# Patient Record
Sex: Female | Born: 1980 | Race: White | Hispanic: No | Marital: Married | Smoking: Never smoker
Health system: Southern US, Community
[De-identification: ages and names within clinical notes are randomized; demographics above are authoritative.]

## PROBLEM LIST (undated history)

## (undated) ENCOUNTER — Inpatient Hospital Stay (HOSPITAL_COMMUNITY): Payer: Self-pay

## (undated) DIAGNOSIS — L02412 Cutaneous abscess of left axilla: Secondary | ICD-10-CM

## (undated) DIAGNOSIS — O039 Complete or unspecified spontaneous abortion without complication: Secondary | ICD-10-CM

## (undated) DIAGNOSIS — F909 Attention-deficit hyperactivity disorder, unspecified type: Secondary | ICD-10-CM

## (undated) DIAGNOSIS — D219 Benign neoplasm of connective and other soft tissue, unspecified: Secondary | ICD-10-CM

## (undated) HISTORY — DX: Complete or unspecified spontaneous abortion without complication: O03.9

## (undated) HISTORY — DX: Cutaneous abscess of left axilla: L02.412

---

## 2000-05-10 HISTORY — PX: DILATION AND CURETTAGE OF UTERUS: SHX78

## 2001-05-10 HISTORY — PX: LIPOSUCTION: SHX10

## 2013-01-27 ENCOUNTER — Inpatient Hospital Stay (HOSPITAL_COMMUNITY)
Admission: AD | Admit: 2013-01-27 | Discharge: 2013-01-27 | Disposition: A | Discharge status: Home / Self Care | Payer: BC Managed Care – PPO | Source: Ambulatory Visit | Attending: Obstetrics & Gynecology | Admitting: Obstetrics & Gynecology

## 2013-01-27 ENCOUNTER — Inpatient Hospital Stay (HOSPITAL_COMMUNITY): Payer: BC Managed Care – PPO

## 2013-01-27 ENCOUNTER — Encounter (HOSPITAL_COMMUNITY): Payer: Self-pay | Admitting: Family

## 2013-01-27 DIAGNOSIS — Z3490 Encounter for supervision of normal pregnancy, unspecified, unspecified trimester: Secondary | ICD-10-CM

## 2013-01-27 DIAGNOSIS — O3410 Maternal care for benign tumor of corpus uteri, unspecified trimester: Secondary | ICD-10-CM | POA: Insufficient documentation

## 2013-01-27 DIAGNOSIS — D259 Leiomyoma of uterus, unspecified: Secondary | ICD-10-CM | POA: Insufficient documentation

## 2013-01-27 DIAGNOSIS — O209 Hemorrhage in early pregnancy, unspecified: Secondary | ICD-10-CM | POA: Insufficient documentation

## 2013-01-27 HISTORY — DX: Attention-deficit hyperactivity disorder, unspecified type: F90.9

## 2013-01-27 LAB — URINALYSIS, ROUTINE W REFLEX MICROSCOPIC
Glucose, UA: NEGATIVE mg/dL
Protein, ur: NEGATIVE mg/dL
Specific Gravity, Urine: 1.025 (ref 1.005–1.030)

## 2013-01-27 LAB — WET PREP, GENITAL: Trich, Wet Prep: NONE SEEN

## 2013-01-27 LAB — CBC
HCT: 35.1 % — ABNORMAL LOW (ref 36.0–46.0)
MCH: 27.6 pg (ref 26.0–34.0)
MCHC: 33.3 g/dL (ref 30.0–36.0)
Platelets: 275 10*3/uL (ref 150–400)
RBC: 4.24 MIL/uL (ref 3.87–5.11)

## 2013-01-27 LAB — URINE MICROSCOPIC-ADD ON

## 2013-01-27 LAB — ABO/RH: ABO/RH(D): O POS

## 2013-01-27 LAB — HCG, QUANTITATIVE, PREGNANCY: hCG, Beta Chain, Quant, S: 15310 m[IU]/mL — ABNORMAL HIGH (ref <5)

## 2013-01-27 LAB — POCT PREGNANCY, URINE: Preg Test, Ur: POSITIVE — AB

## 2013-01-27 MED ORDER — ONDANSETRON 4 MG PO TBDP: 4.0000 mg | ORAL_TABLET | Freq: Three times a day (TID) | ORAL | Status: DC | PRN

## 2013-01-27 NOTE — MAU Note (Addendum)
32 yo, G1P0, presenting to MAU with c/o vaginal bleeding noticed with morning urination. Reports no bleeding since that time. LMP 12/15/12.  Last intercourse yesterday. Denies pain.

## 2013-01-27 NOTE — MAU Note (Signed)
Pt presents with complaints of bright red vaginal bleeding. She states she has had a + HPT and went to her primary care physician and confirmed pregnancy test.

## 2013-01-27 NOTE — MAU Provider Note (Signed)
History     CSN: [id number]  Arrival date and time: 01/27/13 6578   First Provider Initiated Contact with Patient 01/27/13 [phone]      Chief Complaint  Patient presents with  . Vaginal Bleeding   HPI  [name] is a 32 y.o. female; G1P0010 at  [redacted]w[redacted]d who presents with vaginal bleeding following intercourse. She first noticed dark red vaginal bleeding this morning; last intercourse was last evening. She has been experiencing mild cramp like pains in her abdomen for a few weeks; "feels like I'm going to start my cycle".  She was seen by her primary care Dr. This week for confirmation and to discussed "weaning off adderall".  She has an appointment on Oct. 1 with [facility]. She has occasional nausea and minimal vomiting.   OB History   Grav Para Term Preterm Abortions TAB SAB Ect Mult Living   1               Past Medical History  Diagnosis Date  . ADHD (attention deficit hyperactivity disorder)     History reviewed. No pertinent past surgical history.  History reviewed. No pertinent family history.  History  Substance Use Topics  . Smoking status: Never Smoker   . Smokeless tobacco: Never Used  . Alcohol Use: No    Allergies:  Allergies  Allergen Reactions  . Penicillins Rash    Prescriptions prior to admission  Medication Sig Dispense Refill  . amphetamine-dextroamphetamine (ADDERALL XR) 10 MG 24 hr capsule Take 10 mg by mouth daily.      . Prenatal Vit-Fe Fumarate-FA (PRENATAL MULTIVITAMIN) TABS tablet Take 1 tablet by mouth daily at 12 noon.      . Psyllium (METAMUCIL PO) Take 5 mLs by mouth daily. Mixed with liquid       Results for orders placed during the hospital encounter of 01/27/13 (from the past 24 hour(s))  URINALYSIS, ROUTINE W REFLEX MICROSCOPIC     Status: Abnormal   Collection Time    01/27/13  7:30 AM      Result Value Range   Color, Urine YELLOW  YELLOW   APPearance CLEAR  CLEAR   Specific Gravity, Urine 1.025  1.005 - 1.030   pH 6.5  5.0 - 8.0   Glucose, UA NEGATIVE  NEGATIVE mg/dL   Hgb urine dipstick LARGE (*) NEGATIVE   Bilirubin Urine NEGATIVE  NEGATIVE   Ketones, ur NEGATIVE  NEGATIVE mg/dL   Protein, ur NEGATIVE  NEGATIVE mg/dL   Urobilinogen, UA 0.2  0.0 - 1.0 mg/dL   Nitrite NEGATIVE  NEGATIVE   Leukocytes, UA NEGATIVE  NEGATIVE  URINE MICROSCOPIC-ADD ON     Status: Abnormal   Collection Time    01/27/13  7:30 AM      Result Value Range   Squamous Epithelial / LPF FEW (*) RARE   WBC, UA 0-2  <3 WBC/hpf   RBC / HPF 21-50  <3 RBC/hpf   Bacteria, UA FEW (*) RARE   Urine-Other MUCOUS PRESENT    POCT PREGNANCY, URINE     Status: Abnormal   Collection Time    01/27/13  7:36 AM      Result Value Range   Preg Test, Ur POSITIVE (*) NEGATIVE  CBC     Status: Abnormal   Collection Time    01/27/13  8:19 AM      Result Value Range   WBC 7.0  4.0 - 10.5 K/uL   RBC 4.24  3.87 -  5.11 MIL/uL   Hemoglobin 11.7 (*) 12.0 - 15.0 g/dL   HCT 40.9 (*) 81.1 - 91.4 %   MCV 82.8  78.0 - 100.0 fL   MCH 27.6  26.0 - 34.0 pg   MCHC 33.3  30.0 - 36.0 g/dL   RDW 78.2  95.6 - 21.3 %   Platelets 275  150 - 400 K/uL  ABO/RH     Status: None   Collection Time    01/27/13  8:19 AM      Result Value Range   ABO/RH(D) O POS    HCG, QUANTITATIVE, PREGNANCY     Status: Abnormal   Collection Time    01/27/13  8:19 AM      Result Value Range   hCG, Beta Chain, Quant, S 15310 (*) <5 mIU/mL  WET PREP, GENITAL     Status: Abnormal   Collection Time    01/27/13  9:10 AM      Result Value Range   Yeast Wet Prep HPF POC NONE SEEN  NONE SEEN   Trich, Wet Prep NONE SEEN  NONE SEEN   Clue Cells Wet Prep HPF POC NONE SEEN  NONE SEEN   WBC, Wet Prep HPF POC FEW (*) NONE SEEN   US Ob Comp Less 14 Wks  01/27/2013   CLINICAL DATA:  Pregnant, bleeding  EXAM: OBSTETRIC <14 WK [place] AND TRANSVAGINAL OB US  TECHNIQUE: Both transabdominal and transvaginal ultrasound examinations were performed for complete evaluation of the gestation as  well as the maternal uterus, adnexal regions, and pelvic cul-de-sac. Transvaginal technique was performed to assess early pregnancy.  COMPARISON:  None.  FINDINGS: Intrauterine gestational sac: Visualized/normal in shape.  Yolk sac:  Present  Embryo:  Present  Cardiac Activity: Present  Heart Rate:  118 bpm  CRL:   3.3  mm   6 w 0d                  [place] EDC: 09/21/2013  Maternal uterus/adnexae: Small subchorionic hemorrhage.  Two uterine fibroids are present, measuring 4.7 x 4.0 x 4.3 cm posteriorly and 4.4 x 4.0 x 4.7 cm anteriorly.  Right ovary is within normal limits, measuring 3.1 x 1.1 x 1.9 cm.  Left ovary is within normal limits, measuring 4.4 x 3.4 x 2.7 cm, and is notable for a corpus luteal cyst.  IMPRESSION: Single live intrauterine gestation with estimated gestational age [redacted] weeks 0 days by crown-rump length.   Electronically Signed   By: Charline Bills M.D.   On: 01/27/2013 10:15    Review of Systems  Constitutional: Negative for fever and chills.  Gastrointestinal: Positive for nausea and abdominal pain. Negative for vomiting.       Cramping/ Achy feeling in her abdomen   Genitourinary: Negative for dysuria, urgency and frequency.       +vaginal discharge. + vaginal bleeding; notices when she wipes; very light No dysuria.   Neurological: Negative for dizziness and headaches.   Physical Exam   Blood pressure 110/70, pulse 66, temperature 98.6 F (37 C), temperature source Oral, resp. rate 16, height 5' (1.524 m), weight 58.514 kg (129 lb), last menstrual period 12/15/2012.  Physical Exam  Constitutional: She is oriented to person, place, and time. She appears well-developed and well-nourished. No distress.  Neck: Neck supple.  Respiratory: Effort normal.  GI: Soft. She exhibits no distension. There is no tenderness. There is no rebound and no guarding.  Genitourinary: Vaginal discharge found.  Speculum exam: Vagina -  Small amount of dark red vaginal discharge, no odor, I used  one fox swab to clear out vaginal discharge to visualize the cervix.  Cervix - No contact bleeding Bimanual exam: Cervix closed, no CMT Uterus non tender, Gravid  Adnexa non tender, no masses bilaterally GC/Chlam, wet prep done Chaperone present for exam.   Neurological: She is alert and oriented to person, place, and time.  Skin: Skin is warm and dry. She is not diaphoretic.  Psychiatric: Her behavior is normal.    MAU Course  Procedures  MDM Wet Prep Gc/ Chlamydia- pending  CBC Beta Hcg ABO/RH [place]   O positive blood type  Assessment and Plan  A: IUP; US done on 9/20 Small subchorionic hemorrage Uterine fibroids   P: Discharge home Pelvic rest discussed Keep [facility] scheduled [facility] Return to MAU with worsening symptoms; increased bleeding or pain  Take your prenatal vitamin daily RX: Zofran 4 mg ODT Q8 hours as needed for nausea (#20) no rf Support given   Laylynn Campanella IRENE FNP-C 01/27/2013, 10:55 AM

## 2013-01-28 LAB — GC/CHLAMYDIA PROBE AMP
CT Probe RNA: NEGATIVE
GC Probe RNA: NEGATIVE

## 2013-02-19 LAB — OB RESULTS CONSOLE ANTIBODY SCREEN: Antibody Screen: NEGATIVE

## 2013-02-19 LAB — OB RESULTS CONSOLE HIV ANTIBODY (ROUTINE TESTING): HIV: NONREACTIVE

## 2013-02-19 LAB — OB RESULTS CONSOLE GC/CHLAMYDIA
Chlamydia: NEGATIVE
Gonorrhea: NEGATIVE

## 2013-02-19 LAB — OB RESULTS CONSOLE RUBELLA ANTIBODY, IGM: RUBELLA: IMMUNE

## 2013-02-19 LAB — OB RESULTS CONSOLE ABO/RH: RH TYPE: POSITIVE

## 2013-02-19 LAB — OB RESULTS CONSOLE HEPATITIS B SURFACE ANTIGEN: Hepatitis B Surface Ag: NEGATIVE

## 2013-02-19 LAB — OB RESULTS CONSOLE RPR: RPR: NONREACTIVE

## 2013-04-10 ENCOUNTER — Encounter (HOSPITAL_COMMUNITY): Payer: Self-pay | Admitting: *Deleted

## 2013-04-10 ENCOUNTER — Inpatient Hospital Stay (HOSPITAL_COMMUNITY)
Admission: AD | Admit: 2013-04-10 | Discharge: 2013-04-10 | Disposition: A | Discharge status: Home / Self Care | Payer: BC Managed Care – PPO | Source: Ambulatory Visit | Attending: Obstetrics and Gynecology | Admitting: Obstetrics and Gynecology

## 2013-04-10 DIAGNOSIS — O3412 Maternal care for benign tumor of corpus uteri, second trimester: Secondary | ICD-10-CM

## 2013-04-10 DIAGNOSIS — D259 Leiomyoma of uterus, unspecified: Secondary | ICD-10-CM | POA: Insufficient documentation

## 2013-04-10 DIAGNOSIS — M7918 Myalgia, other site: Secondary | ICD-10-CM

## 2013-04-10 DIAGNOSIS — IMO0001 Reserved for inherently not codable concepts without codable children: Secondary | ICD-10-CM | POA: Insufficient documentation

## 2013-04-10 DIAGNOSIS — O3410 Maternal care for benign tumor of corpus uteri, unspecified trimester: Secondary | ICD-10-CM | POA: Insufficient documentation

## 2013-04-10 DIAGNOSIS — R109 Unspecified abdominal pain: Secondary | ICD-10-CM | POA: Insufficient documentation

## 2013-04-10 HISTORY — DX: Benign neoplasm of connective and other soft tissue, unspecified: D21.9

## 2013-04-10 LAB — URINALYSIS, ROUTINE W REFLEX MICROSCOPIC
Bilirubin Urine: NEGATIVE
Glucose, UA: NEGATIVE mg/dL
Ketones, ur: NEGATIVE mg/dL
Leukocytes, UA: NEGATIVE
Nitrite: NEGATIVE
Specific Gravity, Urine: 1.02 (ref 1.005–1.030)
Urobilinogen, UA: 0.2 mg/dL (ref 0.0–1.0)
pH: 7.5 (ref 5.0–8.0)

## 2013-04-10 MED ORDER — CYCLOBENZAPRINE HCL 10 MG PO TABS: 5.0000 mg | ORAL_TABLET | Freq: Three times a day (TID) | ORAL | Status: DC | PRN

## 2013-04-10 MED ORDER — CYCLOBENZAPRINE HCL 10 MG PO TABS
10.0000 mg | ORAL_TABLET | ORAL | Status: AC
  Administered 2013-04-10: 10 mg via ORAL
  Filled 2013-04-10: qty 1

## 2013-04-10 NOTE — MAU Note (Signed)
PT SAYS SHE STARTED HAVING LOWER ABD PAIN  ON Monday AM AT WORK- SEEMS WORSE WHILE ON FEET.   SHE CALLED DR TONIGHT  AT 630PM   =- TOLD TO COME HHERE LAST SEX-  1 WEEK AGO.     FEELS SOME DISCOMFORT  WHILE VOIDING- BUT FEELS SAME WHILE LYING IN BED.

## 2013-04-10 NOTE — MAU Provider Note (Signed)
History     CSN: [id number]  Arrival date and time: 04/10/13 1924   None     Chief Complaint  Patient presents with  . Abdominal Pain   HPI This is a 32 y.o. female at [redacted]w[redacted]d who presents with c/o lower abdominal cramping/aching since yesterday. Denies leaking or bleeding or discharge. Last visit in office was at 9 weeks. States next visit not scheduled until 18 weeks.   RN Note: Pt reports dull lower abd pain since yesterday, has sharp pain comes and goes but the dull pain is constant. Denies bleeding.        OB History   Grav Para Term Preterm Abortions TAB SAB Ect Mult Living   2    1 1           Past Medical History  Diagnosis Date  . ADHD (attention deficit hyperactivity disorder)   . Fibroid     History reviewed. No pertinent past surgical history.  History reviewed. No pertinent family history.  History  Substance Use Topics  . Smoking status: Never Smoker   . Smokeless tobacco: Never Used  . Alcohol Use: No    Allergies:  Allergies  Allergen Reactions  . Penicillins Rash    Prescriptions prior to admission  Medication Sig Dispense Refill  . polyethylene glycol (MIRALAX / GLYCOLAX) packet Take 17 g by mouth daily as needed for mild constipation.      . Prenatal Vit-Fe Fumarate-FA (PRENATAL MULTIVITAMIN) TABS tablet Take 1 tablet by mouth daily at 12 noon.        Review of Systems  Constitutional: Negative for fever, chills and malaise/fatigue.  Gastrointestinal: Positive for abdominal pain. Negative for nausea, vomiting, diarrhea and constipation.  Genitourinary: Negative for dysuria.  Neurological: Negative for dizziness, weakness and headaches.   Physical Exam   Blood pressure 117/71, pulse 90, temperature 98.7 F (37.1 C), temperature source Oral, resp. rate 18, height 4\' 11"  (1.499 m), weight 154 lb (69.854 kg), last menstrual period 12/15/2012, SpO2 100.00%.  Physical Exam  Constitutional: She is oriented to person, place, and time. She  appears well-developed and well-nourished. No distress.  HENT:  Head: Normocephalic.  Cardiovascular: Normal rate.   Respiratory: Effort normal.  GI: Soft. She exhibits no distension and no mass. There is no tenderness. There is no rebound and no guarding.  Genitourinary: Vagina normal and uterus normal. No vaginal discharge found.  Cervix very posterior, long and closed, no bleeding  Musculoskeletal: Normal range of motion.  Neurological: She is alert and oriented to person, place, and time.  Skin: Skin is warm and dry.  Psychiatric: She has a normal mood and affect.    MAU Course  Procedures  MDM UA sent:   Results for orders placed during the hospital encounter of 04/10/13 (from the past 24 hour(s))  URINALYSIS, ROUTINE W REFLEX MICROSCOPIC     Status: None   Collection Time    04/10/13  7:39 PM      Result Value Range   Color, Urine YELLOW  YELLOW   APPearance CLEAR  CLEAR   Specific Gravity, Urine 1.020  1.005 - 1.030   pH 7.5  5.0 - 8.0   Glucose, UA NEGATIVE  NEGATIVE mg/dL   Hgb urine dipstick NEGATIVE  NEGATIVE   Bilirubin Urine NEGATIVE  NEGATIVE   Ketones, ur NEGATIVE  NEGATIVE mg/dL   Protein, ur NEGATIVE  NEGATIVE mg/dL   Urobilinogen, UA 0.2  0.0 - 1.0 mg/dL   Nitrite NEGATIVE  NEGATIVE  Leukocytes, UA NEGATIVE  NEGATIVE     Assessment and Plan  A:  SIUP @ [redacted]w[redacted]d      Cramping  P:  Report to [facility] CNM who will call Dr Bernita Raisin 04/10/2013, 8:17 PM   Addendum following report from Wynelle Bourgeois, CNM:  S: Pt reports she was diagnosed with external uterine fibroids with this pregnancy.  She also reports she has been on her feet more because of the holiday weekend and picked up some children over the weekend.   O: Physical Examination: Abdomen - soft, nontender, gravid appropriate for gestational age, no masses or organomegaly Pt reports increase in pain when she moves her left leg or rolls over in bed  A: Known external uterine  fibroids, one located on left side of uterus Musculoskeletal pain  P: Consult Dr Rana Snare Flexeril 10 mg PO x1 dose in MAU Flexeril 5-10 mg PO TID PRN F/U in office or return to MAU if pain persists or worsens  Sharen Counter Certified Nurse-Midwife

## 2013-04-10 NOTE — MAU Note (Signed)
Pt reports dull lower abd pain since yesterday, has sharp pain comes and goes but the dull pain is constant. Denies bleeding.

## 2013-08-31 LAB — OB RESULTS CONSOLE GBS: STREP GROUP B AG: NEGATIVE

## 2013-09-19 ENCOUNTER — Encounter (HOSPITAL_COMMUNITY): Payer: Self-pay | Admitting: *Deleted

## 2013-09-19 ENCOUNTER — Telehealth (HOSPITAL_COMMUNITY): Payer: Self-pay | Admitting: *Deleted

## 2013-09-19 NOTE — Telephone Encounter (Signed)
Preadmission screen  

## 2013-09-22 ENCOUNTER — Inpatient Hospital Stay (HOSPITAL_COMMUNITY): Payer: BC Managed Care – PPO | Admitting: Anesthesiology

## 2013-09-22 ENCOUNTER — Encounter (HOSPITAL_COMMUNITY): Payer: BC Managed Care – PPO | Admitting: Anesthesiology

## 2013-09-22 ENCOUNTER — Encounter (HOSPITAL_COMMUNITY): Payer: Self-pay | Admitting: Family

## 2013-09-22 ENCOUNTER — Inpatient Hospital Stay (HOSPITAL_COMMUNITY)
Admission: AD | Admit: 2013-09-22 | Discharge: 2013-09-25 | DRG: 766 | Disposition: A | Discharge status: Home / Self Care | Payer: BC Managed Care – PPO | Source: Ambulatory Visit | Attending: Obstetrics and Gynecology | Admitting: Obstetrics and Gynecology

## 2013-09-22 DIAGNOSIS — O3410 Maternal care for benign tumor of corpus uteri, unspecified trimester: Secondary | ICD-10-CM

## 2013-09-22 DIAGNOSIS — IMO0001 Reserved for inherently not codable concepts without codable children: Secondary | ICD-10-CM

## 2013-09-22 DIAGNOSIS — O3660X0 Maternal care for excessive fetal growth, unspecified trimester, not applicable or unspecified: Secondary | ICD-10-CM | POA: Diagnosis present

## 2013-09-22 DIAGNOSIS — O43899 Other placental disorders, unspecified trimester: Secondary | ICD-10-CM | POA: Diagnosis present

## 2013-09-22 DIAGNOSIS — Z98891 History of uterine scar from previous surgery: Secondary | ICD-10-CM

## 2013-09-22 DIAGNOSIS — D259 Leiomyoma of uterus, unspecified: Secondary | ICD-10-CM | POA: Diagnosis present

## 2013-09-22 DIAGNOSIS — O34599 Maternal care for other abnormalities of gravid uterus, unspecified trimester: Secondary | ICD-10-CM | POA: Diagnosis present

## 2013-09-22 LAB — CBC
HEMATOCRIT: 36.1 % (ref 36.0–46.0)
HEMOGLOBIN: 12.2 g/dL (ref 12.0–15.0)
MCH: 27.4 pg (ref 26.0–34.0)
MCHC: 33.8 g/dL (ref 30.0–36.0)
MCV: 81.1 fL (ref 78.0–100.0)
Platelets: 254 10*3/uL (ref 150–400)
RBC: 4.45 MIL/uL (ref 3.87–5.11)
RDW: 14.9 % (ref 11.5–15.5)
WBC: 18.4 10*3/uL — ABNORMAL HIGH (ref 4.0–10.5)

## 2013-09-22 LAB — TYPE AND SCREEN
ABO/RH(D): O POS
Antibody Screen: NEGATIVE

## 2013-09-22 LAB — RPR

## 2013-09-22 LAB — POCT FERN TEST: POCT Fern Test: NEGATIVE

## 2013-09-22 MED ORDER — EPHEDRINE 5 MG/ML INJ: 10.0000 mg | INTRAVENOUS | Status: DC | PRN

## 2013-09-22 MED ORDER — EPHEDRINE 5 MG/ML INJ
INTRAVENOUS | Status: AC
  Filled 2013-09-22: qty 4

## 2013-09-22 MED ORDER — PHENYLEPHRINE 40 MCG/ML (10ML) SYRINGE FOR IV PUSH (FOR BLOOD PRESSURE SUPPORT)
80.0000 ug | PREFILLED_SYRINGE | INTRAVENOUS | Status: DC | PRN
  Administered 2013-09-22 (×2): 80 ug via INTRAVENOUS

## 2013-09-22 MED ORDER — FENTANYL 2.5 MCG/ML BUPIVACAINE 1/10 % EPIDURAL INFUSION (WH - ANES)
INTRAMUSCULAR | Status: AC
  Administered 2013-09-22: 14 mL/h via EPIDURAL
  Filled 2013-09-22: qty 125

## 2013-09-22 MED ORDER — DIPHENHYDRAMINE HCL 50 MG/ML IJ SOLN: 12.5000 mg | INTRAMUSCULAR | Status: DC | PRN

## 2013-09-22 MED ORDER — IBUPROFEN 600 MG PO TABS: 600.0000 mg | ORAL_TABLET | Freq: Four times a day (QID) | ORAL | Status: DC | PRN

## 2013-09-22 MED ORDER — ONDANSETRON HCL 4 MG/2ML IJ SOLN
4.0000 mg | Freq: Four times a day (QID) | INTRAMUSCULAR | Status: DC | PRN
  Administered 2013-09-23: 4 mg via INTRAVENOUS
  Filled 2013-09-22: qty 2

## 2013-09-22 MED ORDER — PHENYLEPHRINE 40 MCG/ML (10ML) SYRINGE FOR IV PUSH (FOR BLOOD PRESSURE SUPPORT)
PREFILLED_SYRINGE | INTRAVENOUS | Status: AC
  Filled 2013-09-22: qty 10

## 2013-09-22 MED ORDER — LACTATED RINGERS IV SOLN
500.0000 mL | INTRAVENOUS | Status: DC | PRN
  Administered 2013-09-22: 300 mL via INTRAVENOUS

## 2013-09-22 MED ORDER — FENTANYL 2.5 MCG/ML BUPIVACAINE 1/10 % EPIDURAL INFUSION (WH - ANES)
14.0000 mL/h | INTRAMUSCULAR | Status: DC | PRN
  Administered 2013-09-22 – 2013-09-23 (×2): 14 mL/h via EPIDURAL
  Filled 2013-09-22: qty 125

## 2013-09-22 MED ORDER — ACETAMINOPHEN 325 MG PO TABS: 650.0000 mg | ORAL_TABLET | ORAL | Status: DC | PRN

## 2013-09-22 MED ORDER — OXYTOCIN 40 UNITS IN LACTATED RINGERS INFUSION - SIMPLE MED: 62.5000 mL/h | INTRAVENOUS | Status: DC

## 2013-09-22 MED ORDER — LACTATED RINGERS IV SOLN
INTRAVENOUS | Status: DC
Start: 2013-09-22 — End: 2013-09-23
  Administered 2013-09-22 – 2013-09-23 (×5): via INTRAVENOUS

## 2013-09-22 MED ORDER — PHENYLEPHRINE 40 MCG/ML (10ML) SYRINGE FOR IV PUSH (FOR BLOOD PRESSURE SUPPORT)
80.0000 ug | PREFILLED_SYRINGE | INTRAVENOUS | Status: DC | PRN
  Filled 2013-09-22: qty 10

## 2013-09-22 MED ORDER — OXYCODONE-ACETAMINOPHEN 5-325 MG PO TABS: 1.0000 | ORAL_TABLET | ORAL | Status: DC | PRN

## 2013-09-22 MED ORDER — OXYTOCIN BOLUS FROM INFUSION: 500.0000 mL | INTRAVENOUS | Status: DC

## 2013-09-22 MED ORDER — CITRIC ACID-SODIUM CITRATE 334-500 MG/5ML PO SOLN
30.0000 mL | ORAL | Status: DC | PRN
  Administered 2013-09-23: 30 mL via ORAL
  Filled 2013-09-22: qty 15

## 2013-09-22 MED ORDER — LIDOCAINE HCL (PF) 1 % IJ SOLN: 30.0000 mL | INTRAMUSCULAR | Status: DC | PRN

## 2013-09-22 MED ORDER — EPHEDRINE 5 MG/ML INJ
10.0000 mg | INTRAVENOUS | Status: DC | PRN
  Filled 2013-09-22: qty 4

## 2013-09-22 MED ORDER — LIDOCAINE HCL (PF) 1 % IJ SOLN
INTRAMUSCULAR | Status: DC | PRN
  Administered 2013-09-22 (×2): 5 mL

## 2013-09-22 MED ORDER — FLEET ENEMA 7-19 GM/118ML RE ENEM: 1.0000 | ENEMA | RECTAL | Status: DC | PRN

## 2013-09-22 MED ORDER — LACTATED RINGERS IV SOLN: 500.0000 mL | Freq: Once | INTRAVENOUS | Status: DC

## 2013-09-22 NOTE — H&P (Signed)
Sharon Richards is a 33 year old G 2 P 0010 at 22 w 1 day presented with labor this am. Possible [place] placenta. History of Uterine fibroids. Maternal Medical History:  Reason for admission: Contractions.   Contractions: Onset was 6-12 hours ago.   Frequency: regular.   Perceived severity is moderate.    Fetal activity: Perceived fetal activity is normal.      OB History   Grav Para Term Preterm Abortions TAB SAB Ect Mult Living   2    1 1          Past Medical History  Diagnosis Date  . ADHD (attention deficit hyperactivity disorder)   . Fibroid    Past Surgical History  Procedure Laterality Date  . Liposuction    . Dilation and curettage of uterus     Family History: family history includes Heart disease in her maternal grandmother. Social History:  reports that she has never smoked. She has never used smokeless tobacco. She reports that she does not drink alcohol or use illicit drugs.   Prenatal Transfer Tool  Maternal Diabetes: No Genetic Screening: Normal Maternal Ultrasounds/Referrals: Normal Fetal Ultrasounds or other Referrals:  None Maternal Substance Abuse:  No Significant Maternal Medications:  None Significant Maternal Lab Results:  None Other Comments:  None  Review of Systems  All other systems reviewed and are negative.   Dilation: 4 Effacement (%): 90 Station: -2 Exam by:: Sharon Rima, MD  Blood pressure 129/83, pulse 100, temperature 98.1 F (36.7 C), temperature source Oral, last menstrual period 12/15/2012. Maternal Exam:  Uterine Assessment: Contraction strength is moderate.  Contraction frequency is irregular.   Abdomen: Fetal presentation: vertex     Fetal Exam Fetal State Assessment: Category I - tracings are normal.     Physical Exam  Nursing note and vitals reviewed. Constitutional: She appears well-developed.  HENT:  Head: Normocephalic.  Eyes: Pupils are equal, round, and reactive to light.  Neck: Normal range of motion.   Cardiovascular: Normal rate and normal heart sounds.   Respiratory: Effort normal.  GI: Soft.  Genitourinary: Vagina normal.    Prenatal labs: ABO, Rh: O/Positive/-- (10/13 0000) Antibody: Negative (10/13 0000) Rubella: Immune (10/13 0000) RPR: Nonreactive (10/13 0000)  HBsAg: Negative (10/13 0000)  HIV: Non-reactive (10/13 0000)  GBS: Negative (04/24 0000)   Assessment/Plan: IUP at 23 w 1 day Labor AROM Epidural Cyril Mourning 09/22/2013, 4:47 PM

## 2013-09-22 NOTE — Anesthesia Preprocedure Evaluation (Signed)

## 2013-09-22 NOTE — Anesthesia Procedure Notes (Signed)
Epidural Patient location during procedure: OB Start time: 09/22/2013 5:14 PM  Staffing Anesthesiologist: Rudean Curt Performed by: anesthesiologist   Preanesthetic Checklist Completed: patient identified, site marked, surgical consent, pre-op evaluation, timeout performed, IV checked, risks and benefits discussed and monitors and equipment checked  Epidural Patient position: sitting Prep: site prepped and draped and DuraPrep Patient monitoring: continuous pulse ox and blood pressure Approach: midline Location: L3-L4 Injection technique: LOR air  Needle:  Needle type: Tuohy  Needle gauge: 17 G Needle length: 9 cm and 9 Needle insertion depth: 5 cm cm Catheter type: closed end flexible Catheter size: 19 Gauge Catheter at skin depth: 10 cm Test dose: negative  Assessment Events: blood not aspirated, injection not painful, no injection resistance, negative IV test and no paresthesia  Additional Notes Patient identified.  Risk benefits discussed including failed block, incomplete pain control, headache, nerve damage, paralysis, blood pressure changes, nausea, vomiting, reactions to medication both toxic or allergic, and postpartum back pain.  Patient expressed understanding and wished to proceed.  All questions were answered.  Sterile technique used throughout procedure and epidural site dressed with sterile barrier dressing. No paresthesia or other complications noted.The patient did not experience any signs of intravascular injection such as tinnitus or metallic taste in mouth nor signs of intrathecal spread such as rapid motor block. Please see nursing notes for vital signs.

## 2013-09-23 ENCOUNTER — Encounter (HOSPITAL_COMMUNITY)
Admission: AD | Disposition: A | Discharge status: Home / Self Care | Payer: Self-pay | Source: Ambulatory Visit | Attending: Obstetrics and Gynecology

## 2013-09-23 ENCOUNTER — Inpatient Hospital Stay (HOSPITAL_COMMUNITY): Admission: RE | Admit: 2013-09-23 | Payer: BC Managed Care – PPO | Source: Ambulatory Visit

## 2013-09-23 ENCOUNTER — Encounter (HOSPITAL_COMMUNITY): Payer: Self-pay | Admitting: *Deleted

## 2013-09-23 DIAGNOSIS — Z98891 History of uterine scar from previous surgery: Secondary | ICD-10-CM

## 2013-09-23 SURGERY — Surgical Case
Anesthesia: Epidural | Site: Abdomen

## 2013-09-23 MED ORDER — CEFAZOLIN SODIUM-DEXTROSE 2-3 GM-% IV SOLR
2.0000 g | INTRAVENOUS | Status: AC
  Administered 2013-09-23: 2 g via INTRAVENOUS
  Filled 2013-09-23: qty 50

## 2013-09-23 MED ORDER — PHENYLEPHRINE 40 MCG/ML (10ML) SYRINGE FOR IV PUSH (FOR BLOOD PRESSURE SUPPORT)
PREFILLED_SYRINGE | INTRAVENOUS | Status: AC
  Filled 2013-09-23: qty 15

## 2013-09-23 MED ORDER — IBUPROFEN 600 MG PO TABS
600.0000 mg | ORAL_TABLET | Freq: Four times a day (QID) | ORAL | Status: DC | PRN
  Administered 2013-09-24: 600 mg via ORAL

## 2013-09-23 MED ORDER — SIMETHICONE 80 MG PO CHEW
80.0000 mg | CHEWABLE_TABLET | Freq: Three times a day (TID) | ORAL | Status: DC
  Administered 2013-09-23 – 2013-09-25 (×5): 80 mg via ORAL
  Filled 2013-09-23 (×5): qty 1

## 2013-09-23 MED ORDER — TETANUS-DIPHTH-ACELL PERTUSSIS 5-2.5-18.5 LF-MCG/0.5 IM SUSP: 0.5000 mL | Freq: Once | INTRAMUSCULAR | Status: DC

## 2013-09-23 MED ORDER — ONDANSETRON HCL 4 MG PO TABS: 4.0000 mg | ORAL_TABLET | ORAL | Status: DC | PRN

## 2013-09-23 MED ORDER — MORPHINE SULFATE 0.5 MG/ML IJ SOLN
INTRAMUSCULAR | Status: AC
  Filled 2013-09-23: qty 10

## 2013-09-23 MED ORDER — IBUPROFEN 600 MG PO TABS
600.0000 mg | ORAL_TABLET | Freq: Four times a day (QID) | ORAL | Status: DC
  Administered 2013-09-23 – 2013-09-25 (×4): 600 mg via ORAL
  Filled 2013-09-23 (×6): qty 1

## 2013-09-23 MED ORDER — CEFAZOLIN SODIUM-DEXTROSE 2-3 GM-% IV SOLR
2.0000 g | Freq: Three times a day (TID) | INTRAVENOUS | Status: DC
  Filled 2013-09-23 (×2): qty 50

## 2013-09-23 MED ORDER — PHENYLEPHRINE 40 MCG/ML (10ML) SYRINGE FOR IV PUSH (FOR BLOOD PRESSURE SUPPORT)
80.0000 ug | PREFILLED_SYRINGE | INTRAVENOUS | Status: DC | PRN
  Filled 2013-09-23: qty 2

## 2013-09-23 MED ORDER — EPHEDRINE 5 MG/ML INJ
10.0000 mg | INTRAVENOUS | Status: DC | PRN
  Filled 2013-09-23: qty 2

## 2013-09-23 MED ORDER — PROMETHAZINE HCL 25 MG/ML IJ SOLN: 6.2500 mg | INTRAMUSCULAR | Status: DC | PRN

## 2013-09-23 MED ORDER — MENTHOL 3 MG MT LOZG: 1.0000 | LOZENGE | OROMUCOSAL | Status: DC | PRN

## 2013-09-23 MED ORDER — SIMETHICONE 80 MG PO CHEW
80.0000 mg | CHEWABLE_TABLET | ORAL | Status: DC
  Administered 2013-09-24 (×2): 80 mg via ORAL
  Filled 2013-09-23 (×2): qty 1

## 2013-09-23 MED ORDER — SODIUM CHLORIDE 0.9 % IJ SOLN: 3.0000 mL | INTRAMUSCULAR | Status: DC | PRN

## 2013-09-23 MED ORDER — DIPHENHYDRAMINE HCL 50 MG/ML IJ SOLN: 12.5000 mg | INTRAMUSCULAR | Status: DC | PRN

## 2013-09-23 MED ORDER — PRENATAL MULTIVITAMIN CH
1.0000 | ORAL_TABLET | Freq: Every day | ORAL | Status: DC
  Administered 2013-09-23 – 2013-09-24 (×2): 1 via ORAL
  Filled 2013-09-23 (×2): qty 1

## 2013-09-23 MED ORDER — MEPERIDINE HCL 25 MG/ML IJ SOLN
INTRAMUSCULAR | Status: AC
  Filled 2013-09-23: qty 1

## 2013-09-23 MED ORDER — MEPERIDINE HCL 25 MG/ML IJ SOLN: 6.2500 mg | INTRAMUSCULAR | Status: DC | PRN

## 2013-09-23 MED ORDER — MIDAZOLAM HCL 2 MG/2ML IJ SOLN: 0.5000 mg | Freq: Once | INTRAMUSCULAR | Status: DC | PRN

## 2013-09-23 MED ORDER — ONDANSETRON HCL 4 MG/2ML IJ SOLN: 4.0000 mg | Freq: Three times a day (TID) | INTRAMUSCULAR | Status: DC | PRN

## 2013-09-23 MED ORDER — NALBUPHINE HCL 10 MG/ML IJ SOLN: 5.0000 mg | INTRAMUSCULAR | Status: DC | PRN

## 2013-09-23 MED ORDER — EPHEDRINE 5 MG/ML INJ
10.0000 mg | INTRAVENOUS | Status: DC | PRN
Start: 2013-09-23 — End: 2013-09-25
  Filled 2013-09-23: qty 2

## 2013-09-23 MED ORDER — ONDANSETRON HCL 4 MG/2ML IJ SOLN
INTRAMUSCULAR | Status: DC | PRN
  Administered 2013-09-23: 4 mg via INTRAVENOUS

## 2013-09-23 MED ORDER — ONDANSETRON HCL 4 MG/2ML IJ SOLN
INTRAMUSCULAR | Status: AC
  Filled 2013-09-23: qty 4

## 2013-09-23 MED ORDER — MORPHINE SULFATE (PF) 0.5 MG/ML IJ SOLN
INTRAMUSCULAR | Status: DC | PRN
  Administered 2013-09-23: 3.5 mg via EPIDURAL

## 2013-09-23 MED ORDER — DIPHENHYDRAMINE HCL 50 MG/ML IJ SOLN: 25.0000 mg | INTRAMUSCULAR | Status: DC | PRN

## 2013-09-23 MED ORDER — OXYTOCIN 10 UNIT/ML IJ SOLN
INTRAMUSCULAR | Status: AC
  Filled 2013-09-23: qty 4

## 2013-09-23 MED ORDER — DIBUCAINE 1 % RE OINT: 1.0000 "application " | TOPICAL_OINTMENT | RECTAL | Status: DC | PRN

## 2013-09-23 MED ORDER — LACTATED RINGERS IV SOLN
40.0000 [IU] | INTRAVENOUS | Status: DC | PRN
  Administered 2013-09-23: 40 [IU] via INTRAVENOUS

## 2013-09-23 MED ORDER — ZOLPIDEM TARTRATE 5 MG PO TABS: 5.0000 mg | ORAL_TABLET | Freq: Every evening | ORAL | Status: DC | PRN

## 2013-09-23 MED ORDER — SENNOSIDES-DOCUSATE SODIUM 8.6-50 MG PO TABS
2.0000 | ORAL_TABLET | ORAL | Status: DC
  Administered 2013-09-24 (×2): 2 via ORAL
  Filled 2013-09-23 (×2): qty 2

## 2013-09-23 MED ORDER — FENTANYL CITRATE 0.05 MG/ML IJ SOLN
INTRAMUSCULAR | Status: AC
  Filled 2013-09-23: qty 2

## 2013-09-23 MED ORDER — SODIUM BICARBONATE 8.4 % IV SOLN
INTRAVENOUS | Status: DC | PRN
  Administered 2013-09-23: 5 mL via EPIDURAL
  Administered 2013-09-23: 10 mL via EPIDURAL

## 2013-09-23 MED ORDER — ONDANSETRON HCL 4 MG/2ML IJ SOLN: 4.0000 mg | INTRAMUSCULAR | Status: DC | PRN

## 2013-09-23 MED ORDER — FENTANYL CITRATE 0.05 MG/ML IJ SOLN
25.0000 ug | INTRAMUSCULAR | Status: DC | PRN
  Administered 2013-09-23 (×2): 12.5 ug via INTRAVENOUS

## 2013-09-23 MED ORDER — KETOROLAC TROMETHAMINE 30 MG/ML IJ SOLN
30.0000 mg | Freq: Four times a day (QID) | INTRAMUSCULAR | Status: AC | PRN
  Administered 2013-09-23: 30 mg via INTRAMUSCULAR

## 2013-09-23 MED ORDER — KETOROLAC TROMETHAMINE 30 MG/ML IJ SOLN
INTRAMUSCULAR | Status: AC
  Filled 2013-09-23: qty 1

## 2013-09-23 MED ORDER — NALOXONE HCL 1 MG/ML IJ SOLN
1.0000 ug/kg/h | INTRAVENOUS | Status: DC | PRN
  Filled 2013-09-23: qty 2

## 2013-09-23 MED ORDER — LANOLIN HYDROUS EX OINT
1.0000 | TOPICAL_OINTMENT | CUTANEOUS | Status: DC | PRN
Start: 2013-09-23 — End: 2013-09-25

## 2013-09-23 MED ORDER — LACTATED RINGERS IV SOLN
INTRAVENOUS | Status: DC
  Administered 2013-09-23 – 2013-09-24 (×2): via INTRAVENOUS

## 2013-09-23 MED ORDER — OXYTOCIN 40 UNITS IN LACTATED RINGERS INFUSION - SIMPLE MED: 62.5000 mL/h | INTRAVENOUS | Status: AC

## 2013-09-23 MED ORDER — WITCH HAZEL-GLYCERIN EX PADS: 1.0000 "application " | MEDICATED_PAD | CUTANEOUS | Status: DC | PRN

## 2013-09-23 MED ORDER — SIMETHICONE 80 MG PO CHEW
80.0000 mg | CHEWABLE_TABLET | ORAL | Status: DC | PRN
Start: 2013-09-23 — End: 2013-09-25

## 2013-09-23 MED ORDER — OXYTOCIN 40 UNITS IN LACTATED RINGERS INFUSION - SIMPLE MED
1.0000 m[IU]/min | INTRAVENOUS | Status: DC
  Administered 2013-09-23: 2 m[IU]/min via INTRAVENOUS
  Filled 2013-09-23: qty 1000

## 2013-09-23 MED ORDER — OXYCODONE-ACETAMINOPHEN 5-325 MG PO TABS
1.0000 | ORAL_TABLET | ORAL | Status: DC | PRN
  Administered 2013-09-23 – 2013-09-24 (×8): 1 via ORAL
  Administered 2013-09-24: 2 via ORAL
  Administered 2013-09-25 (×2): 1 via ORAL
  Filled 2013-09-23 (×4): qty 1
  Filled 2013-09-23 (×3): qty 2
  Filled 2013-09-23 (×3): qty 1

## 2013-09-23 MED ORDER — METOCLOPRAMIDE HCL 5 MG/ML IJ SOLN: 10.0000 mg | Freq: Three times a day (TID) | INTRAMUSCULAR | Status: DC | PRN

## 2013-09-23 MED ORDER — SCOPOLAMINE 1 MG/3DAYS TD PT72
1.0000 | MEDICATED_PATCH | Freq: Once | TRANSDERMAL | Status: DC
  Administered 2013-09-23: 1.5 mg via TRANSDERMAL

## 2013-09-23 MED ORDER — LACTATED RINGERS IV SOLN: 500.0000 mL | Freq: Once | INTRAVENOUS | Status: DC

## 2013-09-23 MED ORDER — ACETAMINOPHEN 500 MG PO TABS
1000.0000 mg | ORAL_TABLET | Freq: Four times a day (QID) | ORAL | Status: AC
  Filled 2013-09-23: qty 2

## 2013-09-23 MED ORDER — NALOXONE HCL 0.4 MG/ML IJ SOLN: 0.4000 mg | INTRAMUSCULAR | Status: DC | PRN

## 2013-09-23 MED ORDER — FENTANYL 2.5 MCG/ML BUPIVACAINE 1/10 % EPIDURAL INFUSION (WH - ANES): 14.0000 mL/h | INTRAMUSCULAR | Status: DC | PRN

## 2013-09-23 MED ORDER — PHENYLEPHRINE 8 MG IN D5W 100 ML (0.08MG/ML) PREMIX OPTIME
INJECTION | INTRAVENOUS | Status: DC | PRN
  Administered 2013-09-23: 80 ug/min via INTRAVENOUS

## 2013-09-23 MED ORDER — TERBUTALINE SULFATE 1 MG/ML IJ SOLN: 0.2500 mg | Freq: Once | INTRAMUSCULAR | Status: DC | PRN

## 2013-09-23 MED ORDER — KETOROLAC TROMETHAMINE 30 MG/ML IJ SOLN: 30.0000 mg | Freq: Four times a day (QID) | INTRAMUSCULAR | Status: AC | PRN

## 2013-09-23 MED ORDER — DIPHENHYDRAMINE HCL 25 MG PO CAPS: 25.0000 mg | ORAL_CAPSULE | Freq: Four times a day (QID) | ORAL | Status: DC | PRN

## 2013-09-23 MED ORDER — SCOPOLAMINE 1 MG/3DAYS TD PT72
MEDICATED_PATCH | TRANSDERMAL | Status: AC
  Filled 2013-09-23: qty 1

## 2013-09-23 MED ORDER — DIPHENHYDRAMINE HCL 25 MG PO CAPS
25.0000 mg | ORAL_CAPSULE | ORAL | Status: DC | PRN
  Filled 2013-09-23: qty 1

## 2013-09-23 SURGICAL SUPPLY — 33 items
BARRIER ADHS 3X4 INTERCEED (GAUZE/BANDAGES/DRESSINGS) IMPLANT
CLAMP CORD UMBIL (MISCELLANEOUS) IMPLANT
CLOTH BEACON ORANGE TIMEOUT ST (SAFETY) ×3 IMPLANT
CONTAINER PREFILL 10% NBF 15ML (MISCELLANEOUS) IMPLANT
DERMABOND ADVANCED (GAUZE/BANDAGES/DRESSINGS) ×2
DERMABOND ADVANCED .7 DNX12 (GAUZE/BANDAGES/DRESSINGS) ×1 IMPLANT
DRAPE LG THREE QUARTER DISP (DRAPES) IMPLANT
DRSG OPSITE POSTOP 4X10 (GAUZE/BANDAGES/DRESSINGS) ×3 IMPLANT
DURAPREP 26ML APPLICATOR (WOUND CARE) ×3 IMPLANT
ELECT REM PT RETURN 9FT ADLT (ELECTROSURGICAL) ×3
ELECTRODE REM PT RTRN 9FT ADLT (ELECTROSURGICAL) ×1 IMPLANT
EXTRACTOR VACUUM M CUP 4 TUBE (SUCTIONS) IMPLANT
EXTRACTOR VACUUM M CUP 4' TUBE (SUCTIONS)
GLOVE BIO SURGEON STRL SZ 6.5 (GLOVE) ×2 IMPLANT
GLOVE BIO SURGEONS STRL SZ 6.5 (GLOVE) ×1
GOWN STRL REUS W/TWL LRG LVL3 (GOWN DISPOSABLE) ×6 IMPLANT
KIT ABG SYR 3ML LUER SLIP (SYRINGE) IMPLANT
NEEDLE HYPO 22GX1.5 SAFETY (NEEDLE) IMPLANT
NEEDLE HYPO 25X5/8 SAFETYGLIDE (NEEDLE) ×3 IMPLANT
NS IRRIG 1000ML POUR BTL (IV SOLUTION) ×3 IMPLANT
PACK C SECTION WH (CUSTOM PROCEDURE TRAY) ×3 IMPLANT
PAD OB MATERNITY 4.3X12.25 (PERSONAL CARE ITEMS) ×3 IMPLANT
STAPLER VISISTAT 35W (STAPLE) IMPLANT
SUT CHROMIC 0 CTX 36 (SUTURE) ×6 IMPLANT
SUT PLAIN 0 NONE (SUTURE) IMPLANT
SUT PLAIN 2 0 XLH (SUTURE) IMPLANT
SUT VIC AB 0 CT1 27 (SUTURE) ×6
SUT VIC AB 0 CT1 27XBRD ANBCTR (SUTURE) ×3 IMPLANT
SUT VIC AB 4-0 KS 27 (SUTURE) IMPLANT
SYR CONTROL 10ML LL (SYRINGE) IMPLANT
TOWEL OR 17X24 6PK STRL BLUE (TOWEL DISPOSABLE) ×3 IMPLANT
TRAY FOLEY CATH 14FR (SET/KITS/TRAYS/PACK) ×3 IMPLANT
WATER STERILE IRR 1000ML POUR (IV SOLUTION) ×3 IMPLANT

## 2013-09-23 NOTE — Progress Notes (Signed)
Patient has been in good labor pattern overnight. Her labor curve has been very slow despite high dose of pitocin FHR decreased variability and periods of late decelerations Cervix is 8 cm -1 with significant caput Recommend Primary LTCS Risks reviewed with patient Consent signed

## 2013-09-23 NOTE — Op Note (Signed)
NAME:  [name], Sharon Richards NO.:  [id number]  MEDICAL RECORD NO.:  [id number]  LOCATION:  5409                          FACILITY:  [facility]  PHYSICIAN:  Davied Nocito L. Loranzo Desha, M.D.DATE OF BIRTH:  02/24/1981  DATE OF PROCEDURE:  09/23/2013 DATE OF DISCHARGE:                              OPERATIVE REPORT   PREOPERATIVE DIAGNOSIS:  Intrauterine pregnancy at term and failure to progress.  POSTOPERATIVE DIAGNOSIS:  Intrauterine pregnancy at term and failure to progress.  PROCEDURE:  Primary low transverse cesarean section.  SURGEON:  Kaiden Dardis L. Helane Rima, M.D.  ANESTHESIA:  Epidural.  EBL:  700 mL.  COMPLICATIONS:  None.  DRAINS:  Foley catheter.  PATHOLOGY:  None.  PROCEDURE:  The patient was taken to the operating room.  Her epidural was dosed and found to be adequate.  She was then prepped and draped in usual sterile fashion.  Low transverse incision was made, carried down the fascia.  Fascia scored in the midline.  The peritoneum was entered bluntly.  The bladder blade was inserted, and the bladder flap was created in standard fashion.  A low transverse incision was made in the uterus.  Uterus was entered using a hemostat.  The amniotic fluid was lightly meconium stained.  The baby was a LGA baby female infant and delivered easily with 1 gentle pull of the vacuum.  The baby was in transverse position.  The cord was clamped and cut.  The baby was handed to the awaiting neonatal team.  The placenta was manually removed noted to be normal intact with a three-vessel cord.  It was not sent to pathology.  The uterine cavity was then cleared of all clots and debris. The uterus was noted to have some fibroids but the uterus was exteriorized.  There was no atony noted.  The uterine incision was closed in 2 layers using 0 chromic in a running, locked stitch.  Uterus was returned to the abdomen.  Irrigation was performed.  Hemostasis was noted.  The peritoneum was closed using  0 Vicryl.  The fascia was closed using 0 Vicryl starting each corner meeting in the midline in a running stitch.  The skin was closed using 4-0 Vicryl in a running stitch.  Dermabond was applied.  All sponge, lap, and instrument counts were correct x2.  The patient went to recovery room in stable condition.     Currie Dennin L. Helane Rima, M.D.     Nevin Bloodgood  D:  09/23/2013  T:  09/23/2013  Job:  [id number]

## 2013-09-23 NOTE — Transfer of Care (Signed)
Immediate Anesthesia Transfer of Care Note  Patient: Sharon Richards  Procedure(s) Performed: Procedure(s): CESAREAN SECTION (N/A)  Patient Location: PACU  Anesthesia Type:Epidural  Level of Consciousness: awake, alert  and oriented  Airway & Oxygen Therapy: Patient Spontanous Breathing  Post-op Assessment: Report given to PACU RN and Post -op Vital signs reviewed and stable. BP 74/31 on arrival to PACU 172mcg phenylephrine given. Repeat BP 102/36  Post vital signs: Reviewed and stable  Complications: No apparent anesthesia complications

## 2013-09-23 NOTE — Anesthesia Postprocedure Evaluation (Signed)
  Anesthesia Post Note  Patient: Sharon Richards  Procedure(s) Performed: Procedure(s) (LRB): CESAREAN SECTION (N/A)  Anesthesia type: Epidural  Patient location: Mother/Baby  Post pain: Pain level controlled  Post assessment: Post-op Vital signs reviewed  Last Vitals:  Filed Vitals:   09/23/13 0751  BP: 99/82  Pulse: 89  Temp:   Resp:     Post vital signs: Reviewed  Level of consciousness: awake  Complications: No apparent anesthesia complications

## 2013-09-23 NOTE — Lactation Note (Signed)
This note was copied from the chart of Sharon Anaston Koehn. Lactation Consultation Note  Patient Name: Sharon Richards Date: 09/23/2013 Reason for consult: Initial assessment of this mom and baby, now >10 hours postpartum.  Baby having latch difficulty due to mom's short nipples.  Mom has readily expressible colostrum. Mom is primipara and has everted nipples but baby extremely fussy when attempting to latch and unable to sustain proper latch until #24 NS was applied, then he was able to calm down and begin rhythmical sucking bursts with intermittent swallows.  RN, Collie Siad also present and will instruct mom later in use of hand pump prior to latch.  LC reviewed temporary use and cleaning of NS, encouraged STS and cue feedings. LC encouraged review of Baby and Me pp 9, 14 and 20-25 for STS and BF information. LC provided [employer] brochure and reviewed [facility] services and list of community and web site resources.     Maternal Data Formula Feeding for Exclusion: No Infant to breast within first hour of birth: Yes (initial LATCH score=6 and baby was able to nurse 20 minutes) Has patient been taught Hand Expression?: Yes (per RN and LC documentation w/feeding attempts) Does the patient have breastfeeding experience prior to this delivery?: No  Feeding Feeding Type: Breast Fed Length of feed:  (few sucks) Baby latched briefly without NS, but fell asleep, then re-latched and was still nursing >5 minutes with NS in place and swallows noted  LATCH Score/Interventions Latch: Repeated attempts needed to sustain latch, nipple held in mouth throughout feeding, stimulation needed to elicit sucking reflex. (sustained beter with NS (#24)) Intervention(s): Skin to skin;Teach feeding cues;Waking techniques (baby also needs some suck training and calming at breast) Intervention(s): Adjust position;Assist with latch;Breast compression  Audible Swallowing: Spontaneous and intermittent (after NS in place, swallows  noted at intervals) Intervention(s): Skin to skin Intervention(s): Alternate breast massage  Type of Nipple: Everted at rest and after stimulation (short nipples and baby unable to sustain deep latch w/o NS)  Comfort (Breast/Nipple): Soft / non-tender     Hold (Positioning): Assistance needed to correctly position infant at breast and maintain latch. Intervention(s): Breastfeeding basics reviewed;Support Pillows;Position options;Skin to skin  LATCH Score: 8 (#24 NS)  Lactation Tools Discussed/Used Tools: Pump;Nipple Shields Nipple shield size: 24 Breast pump type: Manual  STS, cue feedings Signs of proper latch  Consult Status Consult Status: Follow-up Date: 09/24/13 Follow-up type: In-patient    Landis Gandy 09/23/2013, 8:14 PM

## 2013-09-23 NOTE — Transfer of Care (Deleted)
Immediate Anesthesia Transfer of Care Note  Patient: Sharon Richards  Procedure(s) Performed: Procedure(s): CESAREAN SECTION (N/A)  Patient Location: PACU  Anesthesia Type:Epidural  Level of Consciousness: awake, alert  and oriented  Airway & Oxygen Therapy: Patient Spontanous Breathing  Post-op Assessment: Report given to PACU RN and Post -op Vital signs reviewed and stable  Post vital signs: Reviewed and stable  Complications: No apparent anesthesia complications

## 2013-09-23 NOTE — Addendum Note (Signed)
Addendum created 09/23/13 1636 by Asher Muir, CRNA   Modules edited: Notes Section   Notes Section:  File: [id number]

## 2013-09-23 NOTE — Anesthesia Postprocedure Evaluation (Signed)
Anesthesia Post Note  Patient: Sharon Richards  Procedure(s) Performed: Procedure(s) (LRB): CESAREAN SECTION (N/A)  Anesthesia type: Epidural  Patient location: Mother/Baby  Post pain: Pain level controlled  Post assessment: Post-op Vital signs reviewed  Last Vitals:  Filed Vitals:   09/23/13 1431  BP: 100/60  Pulse: 79  Temp: 36.4 C  Resp: 20    Post vital signs: Reviewed  Level of consciousness:alert  Complications: No apparent anesthesia complications

## 2013-09-23 NOTE — Brief Op Note (Signed)
09/22/2013 - 09/23/2013  8:50 AM  PATIENT:  Sharon Richards  33 y.o. female  PRE-OPERATIVE DIAGNOSIS:  IUP at term , Failure to Progress   POST-OPERATIVE DIAGNOSIS:  Same  PROCEDURE:  Procedure(s): CESAREAN SECTION (N/A)  SURGEON:  Surgeon(s) and Role:    * Cyril Mourning, MD - Primary  PHYSICIAN ASSISTANT:   ASSISTANTS: none   ANESTHESIA:   epidural  EBL:  Total I/O In: 1500 [I.V.:1500] Out: 800 [Urine:100; Blood:700]  BLOOD ADMINISTERED:none  DRAINS: Urinary Catheter (Foley)   LOCAL MEDICATIONS USED:  NONE  SPECIMEN:  No specimen  DISPOSITION OF SPECIMEN:  N/A  COUNTS:  YES  TOURNIQUET:  * No tourniquets in log *  DICTATION: .Other Dictation: Dictation Number [id number]  PLAN OF CARE: Admit to inpatient   PATIENT DISPOSITION:  PACU - hemodynamically stable.   Delay start of Pharmacological VTE agent (>24hrs) due to surgical blood loss or risk of bleeding: not applicable

## 2013-09-24 ENCOUNTER — Encounter (HOSPITAL_COMMUNITY): Payer: Self-pay | Admitting: Obstetrics and Gynecology

## 2013-09-24 LAB — CBC
HCT: 25 % — ABNORMAL LOW (ref 36.0–46.0)
HEMOGLOBIN: 8.2 g/dL — AB (ref 12.0–15.0)
MCH: 26.7 pg (ref 26.0–34.0)
MCHC: 32.4 g/dL (ref 30.0–36.0)
MCV: 82.5 fL (ref 78.0–100.0)
Platelets: 190 10*3/uL (ref 150–400)
RBC: 3.03 MIL/uL — AB (ref 3.87–5.11)
RDW: 15.5 % (ref 11.5–15.5)
WBC: 12.4 10*3/uL — AB (ref 4.0–10.5)

## 2013-09-24 MED ORDER — FERROUS SULFATE 325 (65 FE) MG PO TABS
325.0000 mg | ORAL_TABLET | Freq: Two times a day (BID) | ORAL | Status: DC
  Administered 2013-09-24 – 2013-09-25 (×3): 325 mg via ORAL
  Filled 2013-09-24 (×3): qty 1

## 2013-09-24 NOTE — Progress Notes (Signed)
Subjective: Postpartum Day 1: Cesarean Delivery Patient reports tolerating PO and no problems voiding.  Parents request circ.  Objective: Vital signs in last 24 hours: Temp:  [97.3 F (36.3 C)-99.5 F (37.5 C)] 97.3 F (36.3 C) (05/18 0527) Pulse Rate:  [71-110] 89 (05/18 0527) Resp:  [18-38] 20 (05/18 0527) BP: (92-121)/(45-73) 94/59 mmHg (05/18 0527) SpO2:  [93 %-99 %] 98 % (05/18 0527)  Physical Exam:  General: alert, cooperative and appears stated age Lochia: appropriate Uterine Fundus: firm Incision: healing well, no significant drainage DVT Evaluation: No evidence of DVT seen on physical exam. Negative Homan's sign. No cords or calf tenderness.   Recent Labs  09/22/13 1535 09/24/13 0545  HGB 12.2 8.2*  HCT 36.1 25.0*    Assessment/Plan: Status post Cesarean section. Doing well postoperatively.  Continue current care. Counseled re: r/b/a of circumcision.  Informed of risk of bleeding, infection, and scarring.  All questions were answered and the patient wishes to proceed.    Linda Hedges 09/24/2013, 8:05 AM

## 2013-09-24 NOTE — Lactation Note (Signed)
This note was copied from the chart of Sharon Richards. Lactation Consultation Note Baby just finished breastfeeding.  Mother is using NS due to latch difficulty. Told her to call [place] with next feeding to view latch. Patient Name: Sharon Fawna Cranmer [id number] Date: 09/24/2013 Reason for consult: Follow-up assessment   Maternal Data    Feeding Feeding Type: Breast Fed Length of feed: 25 min  LATCH Score/Interventions                      Lactation Tools Discussed/Used     Consult Status Consult Status: Follow-up Date: 09/25/13 Follow-up type: In-patient    Larry Sierras Metzli Pollick 09/24/2013, 2:26 PM

## 2013-09-25 MED ORDER — OXYCODONE-ACETAMINOPHEN 7.5-325 MG PO TABS: 1.0000 | ORAL_TABLET | ORAL | Status: DC | PRN

## 2013-09-25 NOTE — Discharge Summary (Signed)
Obstetric Discharge Summary Reason for Admission: onset of labor Prenatal Procedures: none Intrapartum Procedures: cesarean: low cervical, transverse Postpartum Procedures: none Complications-Operative and Postpartum: none Hemoglobin  Date Value Ref Range Status  09/24/2013 8.2* 12.0 - 15.0 g/dL Final     REPEATED TO VERIFY     DELTA CHECK NOTED     HCT  Date Value Ref Range Status  09/24/2013 25.0* 36.0 - 46.0 % Final    Physical Exam:  General: alert Lochia: appropriate Uterine Fundus: firm Incision: healing well DVT Evaluation: No evidence of DVT seen on physical exam.  Discharge Diagnoses: Term Pregnancy-delivered  Discharge Information: Date: 09/25/2013 Activity: pelvic rest Diet: routine Medications: PNV, Ibuprofen and Percocet Condition: stable Instructions: refer to practice specific booklet Discharge to: home   Newborn Data: Live born female  Birth Weight: 9 lb 0.6 oz (4100 g) APGAR: 8, 9  Home with mother.  [name] 09/25/2013, 7:47 AM

## 2013-09-25 NOTE — Lactation Note (Signed)
This note was copied from the chart of Sharon Richards. Lactation Consultation Note Follow up consult:  Baby latched without NS.  Sucks and swallows observed.  Colostrum in NS.  LS8. Mother's nipples are pink and sore.  Provided comfort gels and reviewed use and ebm. Reviewed how to achieve deeper latch for improved comfort. Encouraged mother to call if she needs further assistance. Patient Name: Sharon Richards [id number] Date: 09/25/2013 Reason for consult: Follow-up assessment   Maternal Data    Feeding Feeding Type: Breast Fed Length of feed: 10 min  LATCH Score/Interventions Latch: Grasps breast easily, tongue down, lips flanged, rhythmical sucking. Intervention(s): Breast compression  Audible Swallowing: Spontaneous and intermittent Intervention(s): Hand expression Intervention(s): Alternate breast massage  Type of Nipple: Everted at rest and after stimulation  Comfort (Breast/Nipple): Filling, red/small blisters or bruises, mild/mod discomfort  Problem noted: Mild/Moderate discomfort Interventions (Mild/moderate discomfort): Comfort gels;Hand expression  Hold (Positioning): Assistance needed to correctly position infant at breast and maintain latch.  LATCH Score: 8  Lactation Tools Discussed/Used     Consult Status      Sharon Richards Sharon Richards 09/25/2013, 9:12 AM

## 2013-09-27 ENCOUNTER — Ambulatory Visit (HOSPITAL_COMMUNITY)
Admission: RE | Admit: 2013-09-27 | Discharge: 2013-09-27 | Disposition: A | Discharge status: Home / Self Care | Payer: BC Managed Care – PPO | Source: Ambulatory Visit | Attending: Obstetrics and Gynecology | Admitting: Obstetrics and Gynecology

## 2013-09-27 ENCOUNTER — Ambulatory Visit (HOSPITAL_COMMUNITY): Payer: BC Managed Care – PPO

## 2013-09-27 ENCOUNTER — Inpatient Hospital Stay (HOSPITAL_COMMUNITY)
Admission: AD | Admit: 2013-09-27 | Discharge: 2013-09-27 | Disposition: A | Discharge status: Home / Self Care | Payer: BC Managed Care – PPO | Source: Ambulatory Visit | Attending: Obstetrics and Gynecology | Admitting: Obstetrics and Gynecology

## 2013-09-27 ENCOUNTER — Encounter (HOSPITAL_COMMUNITY): Payer: Self-pay | Admitting: *Deleted

## 2013-09-27 DIAGNOSIS — R51 Headache: Secondary | ICD-10-CM

## 2013-09-27 DIAGNOSIS — R519 Headache, unspecified: Secondary | ICD-10-CM

## 2013-09-27 DIAGNOSIS — IMO0002 Reserved for concepts with insufficient information to code with codable children: Secondary | ICD-10-CM | POA: Insufficient documentation

## 2013-09-27 DIAGNOSIS — O1205 Gestational edema, complicating the puerperium: Secondary | ICD-10-CM

## 2013-09-27 LAB — URINALYSIS, ROUTINE W REFLEX MICROSCOPIC
Bilirubin Urine: NEGATIVE
GLUCOSE, UA: NEGATIVE mg/dL
Ketones, ur: NEGATIVE mg/dL
Nitrite: NEGATIVE
PROTEIN: NEGATIVE mg/dL
Specific Gravity, Urine: <1.005 — ABNORMAL LOW (ref 1.005–1.030)
Urobilinogen, UA: 0.2 mg/dL (ref 0.0–1.0)
pH: 6 (ref 5.0–8.0)

## 2013-09-27 LAB — URINE MICROSCOPIC-ADD ON

## 2013-09-27 MED ORDER — BUTALBITAL-APAP-CAFFEINE 50-325-40 MG PO CAPS: 1.0000 | ORAL_CAPSULE | Freq: Four times a day (QID) | ORAL | Status: DC | PRN

## 2013-09-27 MED ORDER — BUTALBITAL-APAP-CAFFEINE 50-325-40 MG PO TABS
2.0000 | ORAL_TABLET | Freq: Once | ORAL | Status: AC
  Administered 2013-09-27: 2 via ORAL
  Filled 2013-09-27: qty 2

## 2013-09-27 NOTE — MAU Provider Note (Signed)
History     CSN: [id number]  Arrival date and time: 09/27/13 1856   First Provider Initiated Contact with Patient 09/27/13 0845      Chief Complaint  Patient presents with  . Leg Swelling  . Blurred Vision  . Headache   HPI This is a 33 y.o. female who presents with c/o postpartum swelling and headache. She had a C/S on 5/17 for failure to progress.  Started having blurred vision a few days ago. Headache as well. Swelling has gotten worse on past few days. No history of hypertension.   RN Note:  C/s on 05/17. Headache past 2 days- no relief with meds. Vision started blurring since Tuesday night. Swelling From hips down has been constant. Denies epigastric pain. Baby is doing well, breast feeding going well- changes noted- milk starting to come. Incision doing ok, bleeding wnl.       OB History   Grav Para Term Preterm Abortions TAB SAB Ect Mult Living   2 1 1  1 1    1       Past Medical History  Diagnosis Date  . ADHD (attention deficit hyperactivity disorder)   . Fibroid     Past Surgical History  Procedure Laterality Date  . Liposuction    . Dilation and curettage of uterus    . Cesarean section N/A 09/23/2013    Procedure: CESAREAN SECTION;  Surgeon: Cyril Mourning, MD;  Location: [facility];  Service: Obstetrics;  Laterality: N/A;    Family History  Problem Relation Age of Onset  . Heart disease Maternal Grandmother     History  Substance Use Topics  . Smoking status: Never Smoker   . Smokeless tobacco: Never Used  . Alcohol Use: No    Allergies:  Allergies  Allergen Reactions  . Penicillins Rash    Prescriptions prior to admission  Medication Sig Dispense Refill  . calcium carbonate (TUMS - DOSED IN MG ELEMENTAL CALCIUM) 500 MG chewable tablet Chew 2 tablets by mouth daily as needed for indigestion or heartburn.      . docusate sodium (COLACE) 100 MG capsule Take 100 mg by mouth daily.      Marland Kitchen ibuprofen (ADVIL,MOTRIN) 600 MG tablet Take 600 mg by  mouth every 6 (six) hours as needed for mild pain or moderate pain.      Marland Kitchen oxyCODONE-acetaminophen (PERCOCET) 7.5-325 MG per tablet Take 1 tablet by mouth every 4 (four) hours as needed for pain.  30 tablet  0  . Prenatal Vit-Fe Fumarate-FA (PRENATAL MULTIVITAMIN) TABS tablet Take 1 tablet by mouth daily at 12 noon.        Review of Systems  Constitutional: Negative for fever, chills and malaise/fatigue (but is very tired).  Eyes: Positive for blurred vision. Negative for double vision and photophobia.  Gastrointestinal: Positive for abdominal pain (with incision). Negative for nausea and vomiting.  Neurological: Positive for headaches. Negative for dizziness and focal weakness.   Physical Exam   Blood pressure 118/89, pulse 83, temperature 98.4 F (36.9 C), temperature source Oral, resp. rate 18, last menstrual period 12/15/2012, currently breastfeeding.  Filed Vitals:   09/27/13 0800 09/27/13 0811 09/27/13 0817 09/27/13 0923  BP: 124/81 125/84 118/89 122/82  Pulse: 87 82 83 88  Temp: 98.4 F (36.9 C)     TempSrc: Oral     Resp: 18   18    Physical Exam  Constitutional: She is oriented to person, place, and time. She appears well-developed and well-nourished. No distress.  HENT:  Head: Normocephalic.  Neck: Normal range of motion. Neck supple.  Cardiovascular: Normal rate and regular rhythm.  Exam reveals no gallop and no friction rub.   No murmur heard. Respiratory: Effort normal. No respiratory distress. She has no wheezes. She has no rales.  GI: Soft. There is no tenderness. There is no rebound and no guarding.  Musculoskeletal: Normal range of motion. She exhibits edema (2-3+ pretibial edema).  Neurological: She is alert and oriented to person, place, and time.  Skin: Skin is warm and dry.  Psychiatric: She has a normal mood and affect.    MAU Course  Procedures  MDM  Results for orders placed during the hospital encounter of 09/27/13 (from the past 24 hour(s))   URINALYSIS, ROUTINE W REFLEX MICROSCOPIC     Status: Abnormal   Collection Time    09/27/13  8:08 AM      Result Value Ref Range   Color, Urine YELLOW  YELLOW   APPearance CLEAR  CLEAR   Specific Gravity, Urine <1.005 (*) 1.005 - 1.030   pH 6.0  5.0 - 8.0   Glucose, UA NEGATIVE  NEGATIVE mg/dL   Hgb urine dipstick LARGE (*) NEGATIVE   Bilirubin Urine NEGATIVE  NEGATIVE   Ketones, ur NEGATIVE  NEGATIVE mg/dL   Protein, ur NEGATIVE  NEGATIVE mg/dL   Urobilinogen, UA 0.2  0.0 - 1.0 mg/dL   Nitrite NEGATIVE  NEGATIVE   Leukocytes, UA SMALL (*) NEGATIVE  URINE MICROSCOPIC-ADD ON     Status: None   Collection Time    09/27/13  8:08 AM      Result Value Ref Range   Squamous Epithelial / LPF RARE  RARE   WBC, UA 3-6  <3 WBC/hpf   Bacteria, UA RARE  RARE     Assessment and Plan  A:  Postoperative C/S x 4 days       Postpartum edema       Headache       Normotensive with one borderline diastolic elevation       No proteinuria  P:  Discussed with Dr Julien Girt       Reassured patient       Medicated for headache       Push fluids and elevate legs       Has appt Tuesday in office         Seabron Spates 09/27/2013, 9:16 AM

## 2013-09-27 NOTE — Lactation Note (Signed)
Adult Lactation Consultation Outpatient Visit Note  Patient Name: Sharon Richards                                                      "[place]" Date of Birth: Jun 04, 1980                                                                4 days Gestational Age at Delivery: [redacted]w[redacted]d                                              Weight today: 8-0.7,3650 Type of Delivery: C-Section                                                           BW: 9-0, 1 lb weight loss  Breastfeeding History: Frequency of Breastfeeding: none Length of Feeding: 15 mins on each side Voids: 3 Stools: 2 dark green  Supplementing / Method: 30 ml given with a 10 cc syringe every 3-4 hours Pumping:  Type of Pump:Medela Advanced   Frequency:none, mom just opened today and hand not use yet  Volume:    Comments: Mother is using a #24 nipple shield. Peds office scheduled a follow up with [facility] services today due to weight loss. Mother states that infant has lost weight due to her milk is not in. Mother has been using a #24 nipple shield since in the hospital.  Mothers breast are filling. She states she is feeling some fullness.    Consultation Evaluation: Infant is still slightly jaundice on face and upper body. Mother states that infant is still having meconium stools.   Teaching  proper technique to apply the nipple shield was done. Infant latches with a shallow latch. Attempts were made to latch infant to bare breast. No latch achieved. Parents were taught how to adjust infants lower jaw for wider gape and to roll top lip upward when using the nipple shield. . Infant was not very engaged. Observed a few shallow suckles. Infant was given formula with a curved tip syringe to stimulate suckling. Lots of teaching on proper positioning. Hand expression and breast compression. Infant sustained latch on and off for 15 mins. Observed a very small amt of milk in tip of the nipple shield. Infant only took the amt that was given with the curved tip  syringe.  Infant placed on alternate breast and and another 39ml given .the remainder was given with a gloved finger.  Total feeding 38 ml.   Sat up mothers pump and I had her to post pump  for 20 mins. She obtained 6-7 ml.  Initial Feeding Assessment: Pre-feed Weight:3650 Post-feed PNTIRW:4315 Amount Transferred:16 ml Comments:  Additional feeding: Pre-feed:3666 Post-feed: 3678 Amt transferred: 12 ml   Total Breast milk Transferred this Visit: none Total Supplement  Given: 28 ml with additional 10 ml given with a gloved finger for a total of 18ml  Additional Interventions:  Recommend that mother continue to cue base feed and at least every 2-3 hours.  Advised to supplement infant after each feeding with at least 30-45 ml of EBM/ formula Parents to use curved tip syringe or Dr Owens Shark slow flow nipple (Pace bottle feeding taught) Mother to continue to offer the nipple shield if unable to latch the infant to bare breast.  Suggest that mother post pump at least 4-6 times daily for 20 mins.  Recommend that parents both nap frequently when infant sleeps Mother to follow up for weight check on Tuesday with Peds Follow up for L C consult in one week.   Follow-Up  May 29 at 10:30    [place] 09/27/2013, 2:39 PM

## 2013-09-27 NOTE — Discharge Instructions (Signed)
Edema Edema is an abnormal build-up of fluids in tissues. Because this is partly dependent on gravity (water flows to the lowest place), it is more common in the legs and thighs (lower extremities). It is also common in the looser tissues, like around the eyes. Painless swelling of the feet and ankles is common and increases as a person ages. It may affect both legs and may include the calves or even thighs. When squeezed, the fluid may move out of the affected area and may leave a dent for a few moments. CAUSES   Prolonged standing or sitting in one place for extended periods of time. Movement helps pump tissue fluid into the veins, and absence of movement prevents this, resulting in edema.  Varicose veins. The valves in the veins do not work as well as they should. This causes fluid to leak into the tissues.  Fluid and salt overload.  Injury, burn, or surgery to the leg, ankle, or foot, may damage veins and allow fluid to leak out.  Sunburn damages vessels. Leaky vessels allow fluid to go out into the sunburned tissues.  Allergies (from insect bites or stings, medications or chemicals) cause swelling by allowing vessels to become leaky.  Protein in the blood helps keep fluid in your vessels. Low protein, as in malnutrition, allows fluid to leak out.  Hormonal changes, including pregnancy and menstruation, cause fluid retention. This fluid may leak out of vessels and cause edema.  Medications that cause fluid retention. Examples are sex hormones, blood pressure medications, steroid treatment, or anti-depressants.  Some illnesses cause edema, especially heart failure, kidney disease, or liver disease.  Surgery that cuts veins or lymph nodes, such as surgery done for the heart or for breast cancer, may result in edema. DIAGNOSIS  Your caregiver is usually easily able to determine what is causing your swelling (edema) by simply asking what is wrong (getting a history) and examining you (doing  a physical). Sometimes x-rays, EKG (electrocardiogram or heart tracing), and blood work may be done to evaluate for underlying medical illness. TREATMENT  General treatment includes:  Leg elevation (or elevation of the affected body part).  Restriction of fluid intake.  Prevention of fluid overload.  Compression of the affected body part. Compression with elastic bandages or support stockings squeezes the tissues, preventing fluid from entering and forcing it back into the blood vessels.  Diuretics (also called water pills or fluid pills) pull fluid out of your body in the form of increased urination. These are effective in reducing the swelling, but can have side effects and must be used only under your caregiver's supervision. Diuretics are appropriate only for some types of edema. The specific treatment can be directed at any underlying causes discovered. Heart, liver, or kidney disease should be treated appropriately. HOME CARE INSTRUCTIONS   Elevate the legs (or affected body part) above the level of the heart, while lying down.  Avoid sitting or standing still for prolonged periods of time.  Avoid putting anything directly under the knees when lying down, and do not wear constricting clothing or garters on the upper legs.  Exercising the legs causes the fluid to work back into the veins and lymphatic channels. This may help the swelling go down.  The pressure applied by elastic bandages or support stockings can help reduce ankle swelling.  A low-salt diet may help reduce fluid retention and decrease the ankle swelling.  Take any medications exactly as prescribed. SEEK MEDICAL CARE IF:  Your edema is  not responding to recommended treatments. SEEK IMMEDIATE MEDICAL CARE IF:   You develop shortness of breath or chest pain.  You cannot breathe when you lay down; or if, while lying down, you have to get up and go to the window to get your breath.  You are having increasing  swelling without relief from treatment.  You develop a fever over 102 F (38.9 C).  You develop pain or redness in the areas that are swollen.  Tell your caregiver right away if you have gained 03 lb/1.4 kg in 1 day or 05 lb/2.3 kg in a week. MAKE SURE YOU:   Understand these instructions.  Will watch your condition.  Will get help right away if you are not doing well or get worse. Document Released: 04/26/2005 Document Revised: 10/26/2011 Document Reviewed: 12/13/2007 [facility] Patient Information 2014 [place]. Headaches, Frequently Asked Questions MIGRAINE HEADACHES Q: What is migraine? What causes it? How can I treat it? A: Generally, migraine headaches begin as a dull ache. Then they develop into a constant, throbbing, and pulsating pain. You may experience pain at the temples. You may experience pain at the front or back of one or both sides of the head. The pain is usually accompanied by a combination of:  Nausea.  Vomiting.  Sensitivity to light and noise. Some people (about 15%) experience an aura (see below) before an attack. The cause of migraine is believed to be chemical reactions in the brain. Treatment for migraine may include over-the-counter or prescription medications. It may also include self-help techniques. These include relaxation training and biofeedback.  Q: What is an aura? A: About 15% of people with migraine get an "aura". This is a sign of neurological symptoms that occur before a migraine headache. You may see wavy or jagged lines, dots, or flashing lights. You might experience tunnel vision or blind spots in one or both eyes. The aura can include visual or auditory hallucinations (something imagined). It may include disruptions in smell (such as strange odors), taste or touch. Other symptoms include:  Numbness.  A "pins and needles" sensation.  Difficulty in recalling or speaking the correct word. These neurological events may last as long as 60  minutes. These symptoms will fade as the headache begins. Q: What is a trigger? A: Certain physical or environmental factors can lead to or "trigger" a migraine. These include:  Foods.  Hormonal changes.  Weather.  Stress. It is important to remember that triggers are different for everyone. To help prevent migraine attacks, you need to figure out which triggers affect you. Keep a headache diary. This is a good way to track triggers. The diary will help you talk to your healthcare professional about your condition. Q: Does weather affect migraines? A: Bright sunshine, hot, humid conditions, and drastic changes in barometric pressure may lead to, or "trigger," a migraine attack in some people. But studies have shown that weather does not act as a trigger for everyone with migraines. Q: What is the link between migraine and hormones? A: Hormones start and regulate many of your body's functions. Hormones keep your body in balance within a constantly changing environment. The levels of hormones in your body are unbalanced at times. Examples are during menstruation, pregnancy, or menopause. That can lead to a migraine attack. In fact, about three quarters of all women with migraine report that their attacks are related to the menstrual cycle.  Q: Is there an increased risk of stroke for migraine sufferers? A: The likelihood  of a migraine attack causing a stroke is very remote. That is not to say that migraine sufferers cannot have a stroke associated with their migraines. In persons under age 68, the most common associated factor for stroke is migraine headache. But over the course of a person's normal life span, the occurrence of migraine headache may actually be associated with a reduced risk of dying from cerebrovascular disease due to stroke.  Q: What are acute medications for migraine? A: Acute medications are used to treat the pain of the headache after it has started. Examples over-the-counter  medications, NSAIDs, ergots, and triptans.  Q: What are the triptans? A: Triptans are the newest class of abortive medications. They are specifically targeted to treat migraine. Triptans are vasoconstrictors. They moderate some chemical reactions in the brain. The triptans work on receptors in your brain. Triptans help to restore the balance of a neurotransmitter called serotonin. Fluctuations in levels of serotonin are thought to be a main cause of migraine.  Q: Are over-the-counter medications for migraine effective? A: Over-the-counter, or "OTC," medications may be effective in relieving mild to moderate pain and associated symptoms of migraine. But you should see your caregiver before beginning any treatment regimen for migraine.  Q: What are preventive medications for migraine? A: Preventive medications for migraine are sometimes referred to as "prophylactic" treatments. They are used to reduce the frequency, severity, and length of migraine attacks. Examples of preventive medications include antiepileptic medications, antidepressants, beta-blockers, calcium channel blockers, and NSAIDs (nonsteroidal anti-inflammatory drugs). Q: Why are anticonvulsants used to treat migraine? A: During the past few years, there has been an increased interest in antiepileptic drugs for the prevention of migraine. They are sometimes referred to as "anticonvulsants". Both epilepsy and migraine may be caused by similar reactions in the brain.  Q: Why are antidepressants used to treat migraine? A: Antidepressants are typically used to treat people with depression. They may reduce migraine frequency by regulating chemical levels, such as serotonin, in the brain.  Q: What alternative therapies are used to treat migraine? A: The term "alternative therapies" is often used to describe treatments considered outside the scope of conventional Western medicine. Examples of alternative therapy include acupuncture, acupressure, and  yoga. Another common alternative treatment is herbal therapy. Some herbs are believed to relieve headache pain. Always discuss alternative therapies with your caregiver before proceeding. Some herbal products contain arsenic and other toxins. TENSION HEADACHES Q: What is a tension-type headache? What causes it? How can I treat it? A: Tension-type headaches occur randomly. They are often the result of temporary stress, anxiety, fatigue, or anger. Symptoms include soreness in your temples, a tightening band-like sensation around your head (a "vice-like" ache). Symptoms can also include a pulling feeling, pressure sensations, and contracting head and neck muscles. The headache begins in your forehead, temples, or the back of your head and neck. Treatment for tension-type headache may include over-the-counter or prescription medications. Treatment may also include self-help techniques such as relaxation training and biofeedback. CLUSTER HEADACHES Q: What is a cluster headache? What causes it? How can I treat it? A: Cluster headache gets its name because the attacks come in groups. The pain arrives with little, if any, warning. It is usually on one side of the head. A tearing or bloodshot eye and a runny nose on the same side of the headache may also accompany the pain. Cluster headaches are believed to be caused by chemical reactions in the brain. They have been described as the most  severe and intense of any headache type. Treatment for cluster headache includes prescription medication and oxygen. SINUS HEADACHES Q: What is a sinus headache? What causes it? How can I treat it? A: When a cavity in the bones of the face and skull (a sinus) becomes inflamed, the inflammation will cause localized pain. This condition is usually the result of an allergic reaction, a tumor, or an infection. If your headache is caused by a sinus blockage, such as an infection, you will probably have a fever. An x-ray will confirm a  sinus blockage. Your caregiver's treatment might include antibiotics for the infection, as well as antihistamines or decongestants.  REBOUND HEADACHES Q: What is a rebound headache? What causes it? How can I treat it? A: A pattern of taking acute headache medications too often can lead to a condition known as "rebound headache." A pattern of taking too much headache medication includes taking it more than 2 days per week or in excessive amounts. That means more than the label or a caregiver advises. With rebound headaches, your medications not only stop relieving pain, they actually begin to cause headaches. Doctors treat rebound headache by tapering the medication that is being overused. Sometimes your caregiver will gradually substitute a different type of treatment or medication. Stopping may be a challenge. Regularly overusing a medication increases the potential for serious side effects. Consult a caregiver if you regularly use headache medications more than 2 days per week or more than the label advises. ADDITIONAL QUESTIONS AND ANSWERS Q: What is biofeedback? A: Biofeedback is a self-help treatment. Biofeedback uses special equipment to monitor your body's involuntary physical responses. Biofeedback monitors:  Breathing.  Pulse.  Heart rate.  Temperature.  Muscle tension.  Brain activity. Biofeedback helps you refine and perfect your relaxation exercises. You learn to control the physical responses that are related to stress. Once the technique has been mastered, you do not need the equipment any more. Q: Are headaches hereditary? A: Four out of five (80%) of people that suffer report a family history of migraine. Scientists are not sure if this is genetic or a family predisposition. Despite the uncertainty, a child has a 50% chance of having migraine if one parent suffers. The child has a 75% chance if both parents suffer.  Q: Can children get headaches? A: By the time they reach high  school, most young people have experienced some type of headache. Many safe and effective approaches or medications can prevent a headache from occurring or stop it after it has begun.  Q: What type of doctor should I see to diagnose and treat my headache? A: Start with your primary caregiver. Discuss his or her experience and approach to headaches. Discuss methods of classification, diagnosis, and treatment. Your caregiver may decide to recommend you to a headache specialist, depending upon your symptoms or other physical conditions. Having diabetes, allergies, etc., may require a more comprehensive and inclusive approach to your headache. The National Headache Foundation will provide, upon request, a list of [facility] physician members in your state. Document Released: 07/17/2003 Document Revised: 07/19/2011 Document Reviewed: 12/25/2007 [facility] Patient Information 2014 [place].

## 2013-09-27 NOTE — MAU Note (Addendum)
C/s on 05/17. Headache past 2 days- no relief with meds.  Vision started blurring since Tuesday night. Swelling  From hips down has been constant. Denies epigastric pain.  Baby is doing well, breast feeding going well- changes noted- milk starting to come. Incision doing ok, bleeding wnl.

## 2013-10-05 ENCOUNTER — Ambulatory Visit (HOSPITAL_COMMUNITY): Payer: BC Managed Care – PPO

## 2014-03-11 ENCOUNTER — Encounter (HOSPITAL_COMMUNITY): Payer: Self-pay | Admitting: *Deleted

## 2014-03-22 ENCOUNTER — Ambulatory Visit (INDEPENDENT_AMBULATORY_CARE_PROVIDER_SITE_OTHER): Payer: BC Managed Care – PPO | Admitting: Family Medicine

## 2014-03-22 ENCOUNTER — Encounter: Payer: Self-pay | Admitting: Family Medicine

## 2014-03-22 VITALS — BP 109/78 | HR 88 | Temp 99.2°F | Resp 18 | Ht 59.25 in | Wt 133.0 lb

## 2014-03-22 DIAGNOSIS — L989 Disorder of the skin and subcutaneous tissue, unspecified: Secondary | ICD-10-CM

## 2014-03-22 DIAGNOSIS — F909 Attention-deficit hyperactivity disorder, unspecified type: Secondary | ICD-10-CM

## 2014-03-22 DIAGNOSIS — Z23 Encounter for immunization: Secondary | ICD-10-CM

## 2014-03-22 DIAGNOSIS — F900 Attention-deficit hyperactivity disorder, predominantly inattentive type: Secondary | ICD-10-CM

## 2014-03-22 MED ORDER — AMPHETAMINE-DEXTROAMPHET ER 30 MG PO CP24: 30.0000 mg | ORAL_CAPSULE | Freq: Every day | ORAL | Status: DC

## 2014-03-22 NOTE — Progress Notes (Signed)
Office Note 03/22/2014  CC:  Chief Complaint  Patient presents with  . Establish Care   HPI:  Sharon Richards is a 33 y.o. White female who is here to establish care. Patient's most recent primary MD: Dr. Larey Days once after delivery of her baby, and Dr. Julien Girt (GYN). Old records in EPIC/HL were reviewed prior to or during today's visit.  Has been on adderall XR 30mg  qd long term, lasts about 8 hours.  No adverse side effects. Will be due for RF 04/05/14.  Most recent rx done by Dr. Toy Cookey, not long after deliver of her baby.  Prior to that she was still living in the [place] area and got rx's through her PMD there.  Has a couple of spots on her skin that she has noted since the birth of her baby 5 mo ago, one on right upper arm and one on right lower arm.  Little bump, no itch or pain, no change over time.  LMP 02/24/14.  Last pap 10/2013 after delivering her baby, normal per pt report.  No hx of abnormal paps.  Past Medical History  Diagnosis Date  . ADHD (attention deficit hyperactivity disorder)     dx'd in middle school, has always been on adderall  . Fibroid     Past Surgical History  Procedure Laterality Date  . Liposuction  2003  . Dilation and curettage of uterus  2002  . Cesarean section N/A 09/23/2013    Procedure: CESAREAN SECTION;  Surgeon: Cyril Mourning, MD;  Location: [facility];  Service: Obstetrics;  Laterality: N/A;    Family History  Problem Relation Age of Onset  . Heart disease Maternal Grandmother   . Asthma Sister   . Colon cancer Neg Hx   . Breast cancer Neg Hx     History   Social History  . Marital Status: Married    Spouse Name: N/A    Number of Children: N/A  . Years of Education: N/A   Occupational History  . Not on file.   Social History Main Topics  . Smoking status: Never Smoker   . Smokeless tobacco: Never Used  . Alcohol Use: Yes  . Drug Use: No  . Sexual Activity: Yes    Birth Control/ Protection: None   Other  Topics Concern  . Not on file   Social History Narrative   Married, has one infant son.   [employer] Bachelors degree.  Orig from [place] area.   Occupation: Art therapist at [employer].   No tob, rare alc, no drugs.         MEDS: mirena IUD, adderall XR 30mg  qd  Allergies  Allergen Reactions  . Penicillins Rash   ROS Review of Systems  Constitutional: Negative for fever and fatigue.  HENT: Negative for congestion and sore throat.   Eyes: Negative for visual disturbance.  Respiratory: Negative for cough.   Cardiovascular: Negative for chest pain.  Gastrointestinal: Negative for nausea and abdominal pain.  Genitourinary: Negative for dysuria.  Musculoskeletal: Negative for back pain and joint swelling.  Skin: Negative for rash.  Neurological: Negative for weakness and headaches.  Hematological: Negative for adenopathy.    PE; Blood pressure 109/78, pulse 88, temperature 99.2 F (37.3 C), temperature source Temporal, resp. rate 18, height 4' 11.25" (1.505 m), weight 133 lb (60.328 kg), SpO2 98 %, not currently breastfeeding. Wt Readings from Last 2 Encounters:  03/22/14 133 lb (60.328 kg)  09/22/13 180 lb (81.647 kg)    Gen:  alert, oriented x 4, affect pleasant.  Lucid thinking and conversation noted. HEENT: PERRLA, EOMI.   Neck: no LAD, mass, or thyromegaly. CV: RRR, no m/r/g LUNGS: CTA bilat, nonlabored. NEURO: no tremor or tics noted on observation.  Coordination intact. CN 2-12 grossly intact bilaterally, strength 5/5 in all extremeties.  No ataxia. SKIN: right upper arm and right lower arm each with a tiny area (2-3 mm) of little flesh colored elevations (possibly with subtle verrucous appearance when viewed under otoscope) that are nontender and non-inflamed.  Pertinent labs:  none  ASSESSMENT AND PLAN:   New pt; no old records to obtain.  1) Adult ADD: The current medical regimen is effective;  continue present plan and medications. I printed rx's  for adderall XR 30mg , #30,  today for this month, Dec 2015, and Jan 2016.  Appropriate fill on/after date was noted on each rx.  2) Skin lesions x 2 on right arm: benign appearing, possibly flat warts.  Reassured pt.   Monitor for changes.  Flu vaccine IM today.  An After Visit Summary was printed and given to the patient.   Return in about 4 months (around 07/21/2014) for f/u ADD.

## 2014-03-22 NOTE — Progress Notes (Signed)
Pre visit review using our clinic review tool, if applicable. No additional management support is needed unless otherwise documented below in the visit note. 

## 2014-05-01 ENCOUNTER — Encounter: Payer: Self-pay | Admitting: Nurse Practitioner

## 2014-05-01 ENCOUNTER — Ambulatory Visit (INDEPENDENT_AMBULATORY_CARE_PROVIDER_SITE_OTHER): Payer: BC Managed Care – PPO | Admitting: Nurse Practitioner

## 2014-05-01 VITALS — BP 100/65 | HR 71 | Temp 98.3°F | Ht 59.25 in | Wt 135.0 lb

## 2014-05-01 DIAGNOSIS — J029 Acute pharyngitis, unspecified: Secondary | ICD-10-CM

## 2014-05-01 LAB — POCT RAPID STREP A (OFFICE): RAPID STREP A SCREEN: NEGATIVE

## 2014-05-01 NOTE — Patient Instructions (Signed)
This is likely a viral sore throat/upper respiratory infection. However, if your culture comes back growing bacteria, I will call in an antibiotic. In the meantime, you may use benzocaine throat lozenges or throat spray for comfort. Salt water gargles (1/4 cup warm water mixed with 1/4 tsp salt) twice daily & listerene gargles twice daily. If you develop runny nose, start Neilmed sinus rinses daily.  Rest, sip fluids every hour.  Feel better!  Sore Throat A sore throat is pain, burning, irritation, or scratchiness of the throat. There is often pain or tenderness when swallowing or talking. A sore throat may be accompanied by other symptoms, such as coughing, sneezing, fever, and swollen neck glands. A sore throat is often the first sign of another sickness, such as a cold, flu, strep throat, or mononucleosis (commonly known as mono). Most sore throats go away without medical treatment. CAUSES  The most common causes of a sore throat include:  A viral infection, such as a cold, flu, or mono.  A bacterial infection, such as strep throat, tonsillitis, or whooping cough.  Seasonal allergies.  Dryness in the air.  Irritants, such as smoke or pollution.  Gastroesophageal reflux disease (GERD). HOME CARE INSTRUCTIONS   Only take over-the-counter medicines as directed by your caregiver.  Drink enough fluids to keep your urine clear or pale yellow.  Rest as needed.  Try using throat sprays, lozenges, or sucking on hard candy to ease any pain (if older than 4 years or as directed).  Sip warm liquids, such as broth, herbal tea, or warm water with honey to relieve pain temporarily. You may also eat or drink cold or frozen liquids such as frozen ice pops.  Gargle with salt water (mix 1 tsp salt with 8 oz of water).  Do not smoke and avoid secondhand smoke.  Put a cool-mist humidifier in your bedroom at night to moisten the air. You can also turn on a hot shower and sit in the bathroom with the  door closed for 5 10 minutes. SEEK IMMEDIATE MEDICAL CARE IF:  You have difficulty breathing.  You are unable to swallow fluids, soft foods, or your saliva.  You have increased swelling in the throat.  Your sore throat does not get better in 7 days.  You have nausea and vomiting.  You have a fever or persistent symptoms for more than 2 3 days.  You have a fever and your symptoms suddenly get worse. MAKE SURE YOU:   Understand these instructions.  Will watch your condition.  Will get help right away if you are not doing well or get worse. Document Released: 06/03/2004 Document Revised: 04/12/2012 Document Reviewed: 01/02/2012 [employer] Patient Information 2014 [employer]. 

## 2014-05-01 NOTE — Progress Notes (Signed)
Pre visit review using our clinic review tool, if applicable. No additional management support is needed unless otherwise documented below in the visit note. 

## 2014-05-04 ENCOUNTER — Telehealth: Payer: Self-pay | Admitting: Nurse Practitioner

## 2014-05-04 LAB — CULTURE, UPPER RESPIRATORY: Organism ID, Bacteria: NORMAL

## 2014-05-04 NOTE — Progress Notes (Signed)
   Subjective:    Patient ID: Sharon Richards, female    DOB: August 05, 1980, 33 y.o.   MRN: [id number]  Sore Throat  This is a new problem. The current episode started in the past 7 days (4 d). The problem has been unchanged. Neither side of throat is experiencing more pain than the other. There has been no fever. The pain is moderate. Associated symptoms include congestion. Pertinent negatives include no abdominal pain, coughing, diarrhea, ear pain, headaches or trouble swallowing. Associated symptoms comments: Body aches. She has tried nothing for the symptoms.      Review of Systems  Constitutional: Negative for fever, chills, appetite change and fatigue.  HENT: Positive for congestion and sore throat. Negative for ear pain, trouble swallowing and voice change.   Respiratory: Negative for cough.   Gastrointestinal: Negative for abdominal pain and diarrhea.  Neurological: Negative for headaches.       Objective:   Physical Exam  Constitutional: She is oriented to person, place, and time. She appears well-developed and well-nourished.  HENT:  Head: Normocephalic and atraumatic.  Right Ear: External ear normal.  Left Ear: External ear normal.  Mouth/Throat: No oropharyngeal exudate.  Mild erythema posterior pharynx  Eyes: Conjunctivae are normal. Right eye exhibits no discharge. Left eye exhibits no discharge.  Neck: Normal range of motion. Neck supple. No thyromegaly present.  Cardiovascular: Normal rate, regular rhythm and normal heart sounds.   No murmur heard. Pulmonary/Chest: Effort normal and breath sounds normal. No respiratory distress. She has no wheezes. She has no rales.  Lymphadenopathy:    She has no cervical adenopathy.  Neurological: She is alert and oriented to person, place, and time.  Skin: Skin is warm and dry.  Psychiatric: She has a normal mood and affect. Her behavior is normal. Thought content normal.  Vitals reviewed.         Assessment & Plan:  1. Sore  throat Likely viral - POCT rapid strep A-NEG - Upper Respiratory Culture-pending Symptoms management F/u PRN

## 2014-05-04 NOTE — Telephone Encounter (Signed)
pls call pt: Strep culture neg. Viral respiratory/sore throat illness. Continue with comfort measures as discussed. 

## 2014-05-06 NOTE — Telephone Encounter (Signed)
LMOVM for pt to return call 

## 2014-05-07 NOTE — Telephone Encounter (Signed)
Patient returned call and left vm to cb. Returned  pt's call and left detailed message per patient.

## 2014-05-17 ENCOUNTER — Ambulatory Visit: Payer: BC Managed Care – PPO | Admitting: Family

## 2014-05-31 ENCOUNTER — Ambulatory Visit: Payer: Self-pay | Admitting: Family Medicine

## 2014-06-07 ENCOUNTER — Encounter: Payer: BLUE CROSS/BLUE SHIELD | Admitting: Family Medicine

## 2014-06-07 NOTE — Progress Notes (Deleted)
Pre visit review using our clinic review tool, if applicable. No additional management support is needed unless otherwise documented below in the visit note. 

## 2014-06-13 ENCOUNTER — Ambulatory Visit (INDEPENDENT_AMBULATORY_CARE_PROVIDER_SITE_OTHER): Payer: BLUE CROSS/BLUE SHIELD | Admitting: Family Medicine

## 2014-06-13 ENCOUNTER — Encounter: Payer: Self-pay | Admitting: Family Medicine

## 2014-06-13 VITALS — BP 117/80 | HR 81 | Temp 98.0°F | Resp 18 | Ht 59.25 in | Wt 126.0 lb

## 2014-06-13 DIAGNOSIS — F909 Attention-deficit hyperactivity disorder, unspecified type: Secondary | ICD-10-CM

## 2014-06-13 DIAGNOSIS — F900 Attention-deficit hyperactivity disorder, predominantly inattentive type: Secondary | ICD-10-CM

## 2014-06-13 MED ORDER — AMPHETAMINE-DEXTROAMPHET ER 30 MG PO CP24: 30.0000 mg | ORAL_CAPSULE | Freq: Every day | ORAL | Status: DC

## 2014-06-13 MED ORDER — AMPHETAMINE-DEXTROAMPHETAMINE 10 MG PO TABS: ORAL_TABLET | ORAL | Status: DC

## 2014-06-13 NOTE — Progress Notes (Signed)
OFFICE NOTE  06/13/2014  CC:  Chief Complaint  Patient presents with  . Follow-up   HPI: Patient is a 34 y.o. Caucasian female who is here for 3 mo f/u adult ADHD. Dose is effective but lasting only 8 hours before she feels a crash.  Feels irritable/moody at that time and has to "push through" with extra effort and feels foggy headed.  No other side effects. In the past she was on strattera and vyvanse in the past and both caused HA's.   Her wt is down 7 lbs since last visit and this was purposeful--"getting baby wt off".  Pertinent PMH:  Past medical, surgical, social, and family history reviewed and no changes are noted since last office visit.  MEDS:  Outpatient Prescriptions Prior to Visit  Medication Sig Dispense Refill  . amphetamine-dextroamphetamine (ADDERALL XR) 30 MG 24 hr capsule Take 1 capsule (30 mg total) by mouth daily. 30 capsule 0  . levonorgestrel (MIRENA) 20 MCG/24HR IUD 1 each by Intrauterine route once.     No facility-administered medications prior to visit.    PE: Blood pressure 117/80, pulse 81, temperature 98 F (36.7 C), temperature source Temporal, resp. rate 18, height 4' 11.25" (1.505 m), weight 126 lb (57.153 kg), SpO2 97 %, not currently breastfeeding. Wt Readings from Last 2 Encounters:  06/13/14 126 lb (57.153 kg)  05/01/14 135 lb (61.236 kg)    Gen: alert, oriented x 4, affect pleasant.  Lucid thinking and conversation noted. HEENT: PERRLA, EOMI.   Neck: no LAD, mass, or thyromegaly. CV: RRR, no m/r/g LUNGS: CTA bilat, nonlabored. NEURO: no tremor or tics noted on observation.  Coordination intact. CN 2-12 grossly intact bilaterally, strength 5/5 in all extremeties.  No ataxia.   IMPRESSION AND PLAN:  Adult ADHD: responds well to adderall but the XR is only lasting 8 hours. Discussed options with pt and since she has not tolerated vyvanse in the past, will stick with current adderall XR dosing in AM and add a 10mg  dose of short acting  adderall at about 2-3 pm.  Therapeutic expectations and side effect profile of medication discussed today.  Patient's questions answered. Patient has never been on a methylphenidate product in the past or intuniv so we have these as backup options if our treatment regimen decided upon today is not optimal.  I printed rx's for Adderall XR 30mg , 1 qAm, #30 and Adderall 10mg  ("plain") 1 qd at 2pm, #30  today for this month, march 2016, and April 2016.  Appropriate fill on/after date was noted on each rx.   An After Visit Summary was printed and given to the patient.  FOLLOW UP: 49mo

## 2014-06-13 NOTE — Progress Notes (Signed)
Pre visit review using our clinic review tool, if applicable. No additional management support is needed unless otherwise documented below in the visit note. 

## 2014-09-06 ENCOUNTER — Ambulatory Visit (INDEPENDENT_AMBULATORY_CARE_PROVIDER_SITE_OTHER): Payer: BLUE CROSS/BLUE SHIELD | Admitting: Family Medicine

## 2014-09-06 ENCOUNTER — Encounter: Payer: Self-pay | Admitting: Family Medicine

## 2014-09-06 VITALS — BP 112/79 | HR 86 | Temp 98.1°F | Resp 18 | Ht 59.25 in | Wt 125.0 lb

## 2014-09-06 DIAGNOSIS — F900 Attention-deficit hyperactivity disorder, predominantly inattentive type: Secondary | ICD-10-CM | POA: Diagnosis not present

## 2014-09-06 DIAGNOSIS — F909 Attention-deficit hyperactivity disorder, unspecified type: Secondary | ICD-10-CM

## 2014-09-06 MED ORDER — AMPHETAMINE-DEXTROAMPHETAMINE 10 MG PO TABS: ORAL_TABLET | ORAL | Status: DC

## 2014-09-06 MED ORDER — AMPHETAMINE-DEXTROAMPHET ER 30 MG PO CP24: 30.0000 mg | ORAL_CAPSULE | Freq: Every day | ORAL | Status: DC

## 2014-09-06 NOTE — Progress Notes (Signed)
Pre visit review using our clinic review tool, if applicable. No additional management support is needed unless otherwise documented below in the visit note. 

## 2014-09-06 NOTE — Progress Notes (Signed)
OFFICE NOTE  09/06/2014  CC:  Chief Complaint  Patient presents with  . Follow-up     HPI: Patient is a 34 y.o. Caucasian female who is here for 3 mo f/u adult ADD. Doing well, focus/concentration are good on current dosing and duration of action is good with current dosing. No adverse side effects.  Pertinent PMH:  Past medical, surgical, social, and family history reviewed and no changes are noted since last office visit.  MEDS:  Outpatient Prescriptions Prior to Visit  Medication Sig Dispense Refill  . levonorgestrel (MIRENA) 20 MCG/24HR IUD 1 each by Intrauterine route once.    Marland Kitchen amphetamine-dextroamphetamine (ADDERALL XR) 30 MG 24 hr capsule Take 1 capsule (30 mg total) by mouth daily. 30 capsule 0  . amphetamine-dextroamphetamine (ADDERALL) 10 MG tablet 1 tab po q 2 pm 30 tablet 0   No facility-administered medications prior to visit.    PE: Blood pressure 112/79, pulse 86, temperature 98.1 F (36.7 C), temperature source Temporal, resp. rate 18, height 4' 11.25" (1.505 m), weight 125 lb (56.7 kg), SpO2 99 %, not currently breastfeeding. Wt Readings from Last 2 Encounters:  09/06/14 125 lb (56.7 kg)  06/13/14 126 lb (57.153 kg)    Gen: alert, oriented x 4, affect pleasant.  Lucid thinking and conversation noted. HEENT: PERRLA, EOMI.   Neck: no LAD, mass, or thyromegaly. CV: RRR, no m/r/g LUNGS: CTA bilat, nonlabored. NEURO: no tremor or tics noted on observation.  Coordination intact. CN 2-12 grossly intact bilaterally, strength 5/5 in all extremeties.  No ataxia.   IMPRESSION AND PLAN:  Adult ADD; The current medical regimen is effective;  continue present plan and medications. I printed rx's for adderall XR 30mg  qAM, #30 and adderall "plain" 10 mg po q2pm, #30 today for May, June, and July 2016.  Appropriate fill on/after date was noted on each rx.  An After Visit Summary was printed and given to the patient.  FOLLOW UP: 4 mo

## 2014-09-09 ENCOUNTER — Ambulatory Visit: Payer: BLUE CROSS/BLUE SHIELD | Admitting: Family Medicine

## 2014-12-02 ENCOUNTER — Encounter: Payer: Self-pay | Admitting: Family Medicine

## 2014-12-02 ENCOUNTER — Ambulatory Visit (INDEPENDENT_AMBULATORY_CARE_PROVIDER_SITE_OTHER): Payer: BLUE CROSS/BLUE SHIELD | Admitting: Family Medicine

## 2014-12-02 VITALS — BP 107/76 | HR 82 | Temp 98.0°F | Resp 16 | Ht 59.25 in | Wt 123.0 lb

## 2014-12-02 DIAGNOSIS — F900 Attention-deficit hyperactivity disorder, predominantly inattentive type: Secondary | ICD-10-CM | POA: Diagnosis not present

## 2014-12-02 DIAGNOSIS — F909 Attention-deficit hyperactivity disorder, unspecified type: Secondary | ICD-10-CM

## 2014-12-02 MED ORDER — AMPHETAMINE-DEXTROAMPHET ER 30 MG PO CP24: 30.0000 mg | ORAL_CAPSULE | Freq: Every day | ORAL | Status: DC

## 2014-12-02 MED ORDER — AMPHETAMINE-DEXTROAMPHETAMINE 10 MG PO TABS: ORAL_TABLET | ORAL | Status: DC

## 2014-12-02 NOTE — Progress Notes (Signed)
OFFICE NOTE  12/02/2014  CC:  Chief Complaint  Patient presents with  . Follow-up    Medication refill, pt is not fasting.    HPI: Patient is a 34 y.o. Caucasian female who is here for 3 mo f/u adult ADD.   She takes adderall XR in morning and since the med lasts only about 8 hours for her, she takes a dose of adderall IR in the afternoon.   Pertinent PMH:  Past Medical History  Diagnosis Date  . ADHD (attention deficit hyperactivity disorder)     dx'd in middle school, has always been on adderall  . Fibroid    Past Surgical History  Procedure Laterality Date  . Liposuction  2003  . Dilation and curettage of uterus  2002  . Cesarean section N/A 09/23/2013    Procedure: CESAREAN SECTION;  Surgeon: Cyril Mourning, MD;  Location: [facility];  Service: Obstetrics;  Laterality: N/A;    MEDS:  Outpatient Prescriptions Prior to Visit  Medication Sig Dispense Refill  . amphetamine-dextroamphetamine (ADDERALL XR) 30 MG 24 hr capsule Take 1 capsule (30 mg total) by mouth daily. 30 capsule 0  . amphetamine-dextroamphetamine (ADDERALL) 10 MG tablet 1 tab po q 2 pm 30 tablet 0  . levonorgestrel (MIRENA) 20 MCG/24HR IUD 1 each by Intrauterine route once.     No facility-administered medications prior to visit.    PE: Blood pressure 107/76, pulse 82, temperature 98 F (36.7 C), temperature source Oral, resp. rate 16, height 4' 11.25" (1.505 m), weight 123 lb (55.792 kg), last menstrual period 11/15/2014, SpO2 100 %, not currently breastfeeding. Wt Readings from Last 2 Encounters:  12/02/14 123 lb (55.792 kg)  09/06/14 125 lb (56.7 kg)    Gen: alert, oriented x 4, affect pleasant.  Lucid thinking and conversation noted. HEENT: PERRLA, EOMI.   Neck: no LAD, mass, or thyromegaly. CV: RRR, no m/r/g LUNGS: CTA bilat, nonlabored. NEURO: no tremor or tics noted on observation.  Coordination intact. CN 2-12 grossly intact bilaterally, strength 5/5 in all extremeties.  No  ataxia.   IMPRESSION AND PLAN:  Adult ADHD, doing well. The current medical regimen is effective;  continue present plan and medications. I printed rx's for adderall XR 30mg , 1 po qAM, #30 and adderall IR 10mg  1 tab po q afternoon, #30 today for this month, August, and September 2016.  Appropriate fill on/after date was noted on each rx.  An After Visit Summary was printed and given to the patient.  FOLLOW UP: 3 mo

## 2014-12-02 NOTE — Addendum Note (Signed)
Addended by: Lanae Crumbly on: 12/02/2014 10:43 AM   Modules accepted: Orders

## 2014-12-02 NOTE — Progress Notes (Signed)
Pre visit review using our clinic review tool, if applicable. No additional management support is needed unless otherwise documented below in the visit note. 

## 2015-03-07 ENCOUNTER — Ambulatory Visit (INDEPENDENT_AMBULATORY_CARE_PROVIDER_SITE_OTHER): Payer: BLUE CROSS/BLUE SHIELD | Admitting: Family Medicine

## 2015-03-07 ENCOUNTER — Encounter: Payer: Self-pay | Admitting: Family Medicine

## 2015-03-07 VITALS — BP 108/77 | HR 71 | Temp 98.1°F | Resp 16 | Ht 59.25 in | Wt 125.0 lb

## 2015-03-07 DIAGNOSIS — Z23 Encounter for immunization: Secondary | ICD-10-CM

## 2015-03-07 DIAGNOSIS — F909 Attention-deficit hyperactivity disorder, unspecified type: Secondary | ICD-10-CM

## 2015-03-07 DIAGNOSIS — F900 Attention-deficit hyperactivity disorder, predominantly inattentive type: Secondary | ICD-10-CM

## 2015-03-07 MED ORDER — AMPHETAMINE-DEXTROAMPHETAMINE 10 MG PO TABS: ORAL_TABLET | ORAL | Status: DC

## 2015-03-07 MED ORDER — AMPHETAMINE-DEXTROAMPHET ER 30 MG PO CP24: 30.0000 mg | ORAL_CAPSULE | Freq: Every day | ORAL | Status: DC

## 2015-03-07 NOTE — Progress Notes (Signed)
Pre visit review using our clinic review tool, if applicable. No additional management support is needed unless otherwise documented below in the visit note. 

## 2015-03-07 NOTE — Progress Notes (Signed)
OFFICE NOTE  03/07/2015  CC:  Chief Complaint  Patient presents with  . Follow-up    Pt is not fasting.      HPI: Patient is a 34 y.o. Caucasian female who is here for 4 mo f/u adult ADHD. Doing well with concentration/focus on adderral regimen.  No adverse side effects from med.    Pertinent PMH:  Past medical, surgical, social, and family history reviewed and no changes are noted since last office visit.  MEDS:  Outpatient Prescriptions Prior to Visit  Medication Sig Dispense Refill  . amphetamine-dextroamphetamine (ADDERALL XR) 30 MG 24 hr capsule Take 1 capsule (30 mg total) by mouth daily. 30 capsule 0  . amphetamine-dextroamphetamine (ADDERALL) 10 MG tablet 1 tab po q 2 pm 30 tablet 0  . levonorgestrel (MIRENA) 20 MCG/24HR IUD 1 each by Intrauterine route once.     No facility-administered medications prior to visit.    PE: Blood pressure 108/77, pulse 71, temperature 98.1 F (36.7 C), temperature source Oral, resp. rate 16, height 4' 11.25" (1.505 m), weight 125 lb (56.7 kg), SpO2 98 %, not currently breastfeeding. Wt Readings from Last 2 Encounters:  03/07/15 125 lb (56.7 kg)  12/02/14 123 lb (55.792 kg)    Gen: alert, oriented x 4, affect pleasant.  Lucid thinking and conversation noted. HEENT: PERRLA, EOMI.   Neck: no LAD, mass, or thyromegaly. CV: RRR, no m/r/g LUNGS: CTA bilat, nonlabored. NEURO: no tremor or tics noted on observation.  Coordination intact. CN 2-12 grossly intact bilaterally, strength 5/5 in all extremeties.  No ataxia.   IMPRESSION AND PLAN:  Adult ADHD; The current medical regimen is effective;  continue present plan and medications. I printed rx's for adderall xr 30 qd, #30 and adderall IR 10mg  1 q2pm, #30 today for [facility], Dec 2016, and Jan 2017.  Appropriate fill on/after date was noted on each rx.  An After Visit Summary was printed and given to the patient.  FOLLOW UP: 45mo

## 2015-05-30 ENCOUNTER — Ambulatory Visit (INDEPENDENT_AMBULATORY_CARE_PROVIDER_SITE_OTHER): Payer: BLUE CROSS/BLUE SHIELD | Admitting: Family Medicine

## 2015-05-30 ENCOUNTER — Encounter: Payer: Self-pay | Admitting: Family Medicine

## 2015-05-30 VITALS — BP 111/78 | HR 76 | Temp 97.8°F | Resp 16 | Ht 59.25 in | Wt 128.2 lb

## 2015-05-30 DIAGNOSIS — F900 Attention-deficit hyperactivity disorder, predominantly inattentive type: Secondary | ICD-10-CM | POA: Diagnosis not present

## 2015-05-30 DIAGNOSIS — F909 Attention-deficit hyperactivity disorder, unspecified type: Secondary | ICD-10-CM

## 2015-05-30 MED ORDER — AMPHETAMINE-DEXTROAMPHETAMINE 10 MG PO TABS: ORAL_TABLET | ORAL | Status: DC

## 2015-05-30 MED ORDER — AMPHETAMINE-DEXTROAMPHET ER 30 MG PO CP24: 30.0000 mg | ORAL_CAPSULE | Freq: Every day | ORAL | Status: DC

## 2015-05-30 NOTE — Progress Notes (Signed)
Pre visit review using our clinic review tool, if applicable. No additional management support is needed unless otherwise documented below in the visit note. 

## 2015-05-30 NOTE — Progress Notes (Signed)
OFFICE VISIT  05/30/2015   CC:  Chief Complaint  Patient presents with  . Follow-up    Pt is not fasting.    HPI:    Patient is a 35 y.o. Caucasian female who presents for 3 mo f/u adult ADHD. Doing well with concentration/focus, takes adderall as rx'd daily. No side effects.   No new complaints.   Past Medical History  Diagnosis Date  . ADHD (attention deficit hyperactivity disorder)     dx'd in middle school, has always been on adderall  . Fibroid     Past Surgical History  Procedure Laterality Date  . Liposuction  2003  . Dilation and curettage of uterus  2002  . Cesarean section N/A 09/23/2013    Procedure: CESAREAN SECTION;  Surgeon: Cyril Mourning, MD;  Location: [facility];  Service: Obstetrics;  Laterality: N/A;    Outpatient Prescriptions Prior to Visit  Medication Sig Dispense Refill  . levonorgestrel (MIRENA) 20 MCG/24HR IUD 1 each by Intrauterine route once.    Marland Kitchen amphetamine-dextroamphetamine (ADDERALL XR) 30 MG 24 hr capsule Take 1 capsule (30 mg total) by mouth daily. 30 capsule 0  . amphetamine-dextroamphetamine (ADDERALL) 10 MG tablet 1 tab po q 2 pm 30 tablet 0   No facility-administered medications prior to visit.    Allergies  Allergen Reactions  . Penicillins Rash    ROS As per HPI  PE: Blood pressure 111/78, pulse 76, temperature 97.8 F (36.6 C), temperature source Oral, resp. rate 16, height 4' 11.25" (1.505 m), weight 128 lb 4 oz (58.174 kg), SpO2 99 %, not currently breastfeeding. Wt Readings from Last 2 Encounters:  05/30/15 128 lb 4 oz (58.174 kg)  03/07/15 125 lb (56.7 kg)    Gen: alert, oriented x 4, affect pleasant.  Lucid thinking and conversation noted. HEENT: PERRLA, EOMI.   Neck: no LAD, mass, or thyromegaly. CV: RRR, no m/r/g LUNGS: CTA bilat, nonlabored. NEURO: no tremor or tics noted on observation.  Coordination intact. CN 2-12 grossly intact bilaterally, strength 5/5 in all extremeties.  No ataxia.   LABS:   none  IMPRESSION AND PLAN:  Adult ADD; The current medical regimen is effective;  continue present plan and medications. I printed rx's for adderall XR 30mg  qd, #30 for each of the next 3 mo's today and for adderall 10mg  1 tab at 2 pm qd, #30 for each of the next 3 months.  Appropriate fill on/after date was noted on each rx.  An After Visit Summary was printed and given to the patient.  FOLLOW UP: Return in about 3 months (around 08/28/2015) for Adult ADD f/u.

## 2015-05-31 IMAGING — US US OB TRANSVAGINAL
1 series · 14 of 28 positions shown · non-contrast
Comparison: None.

CLINICAL DATA: Pregnant, bleeding

EXAM:
OBSTETRIC <14 WK US AND TRANSVAGINAL OB US
TECHNIQUE: Both transabdominal and transvaginal ultrasound examinations were
performed for complete evaluation of the gestation as well as the
maternal uterus, adnexal regions, and pelvic cul-de-sac.
Transvaginal technique was performed to assess early pregnancy.

[Series 1: us ob transvaginal · 14 of 49 slices shown]
[im 2/49]
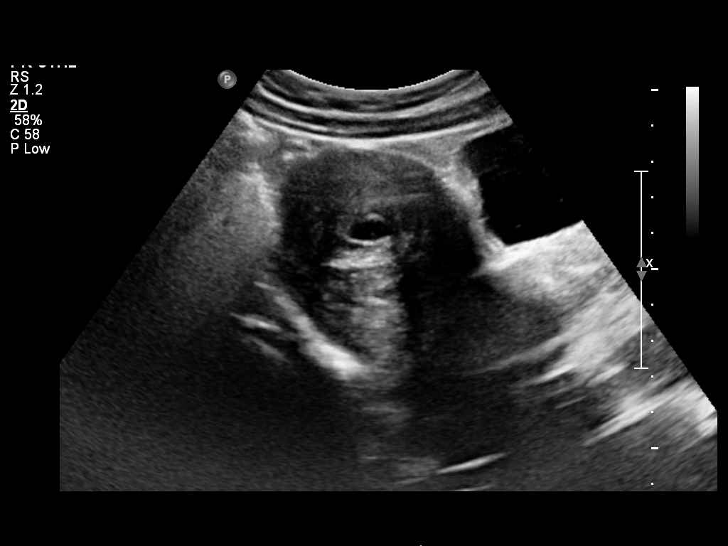
[im 6/49]
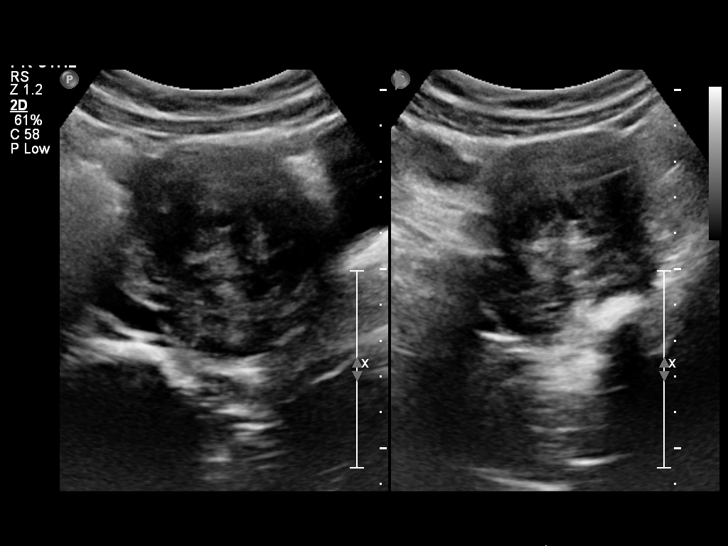
[im 9/49]
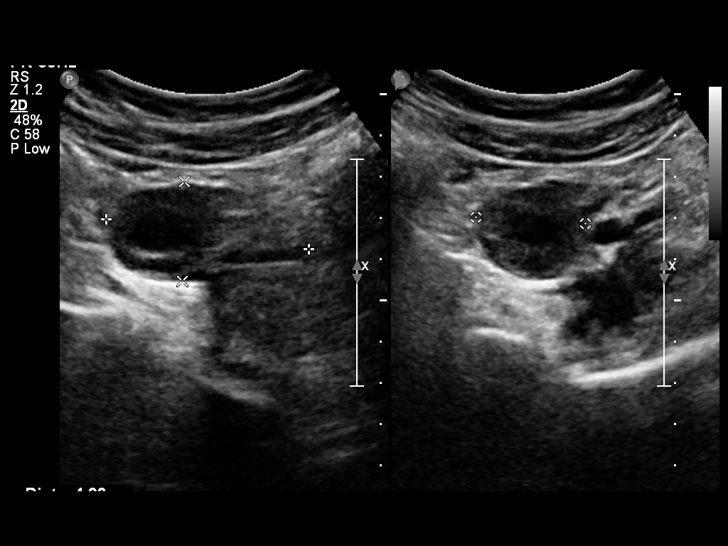
[im 13/49]
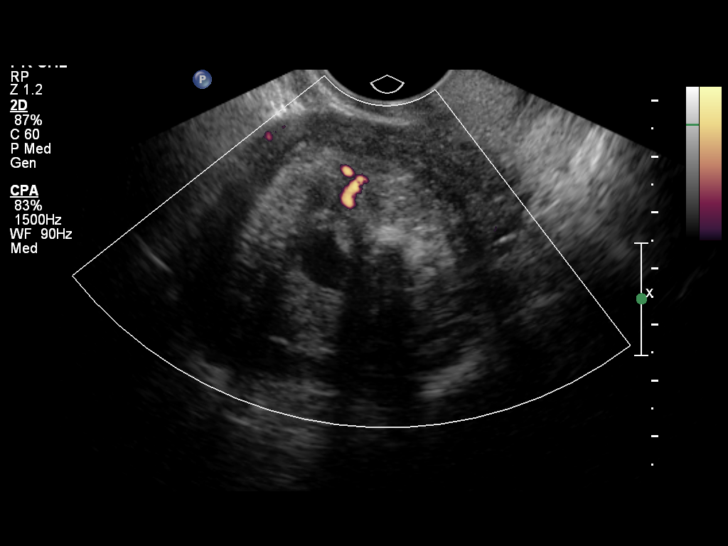
[im 17/49]
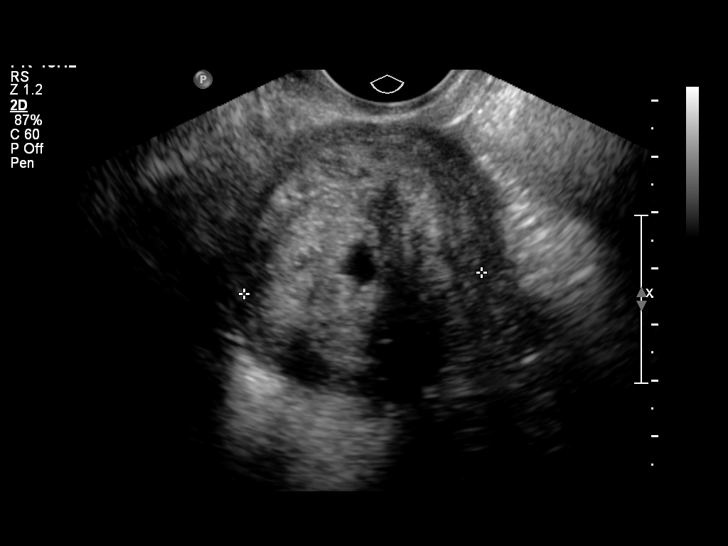
[im 20/49]
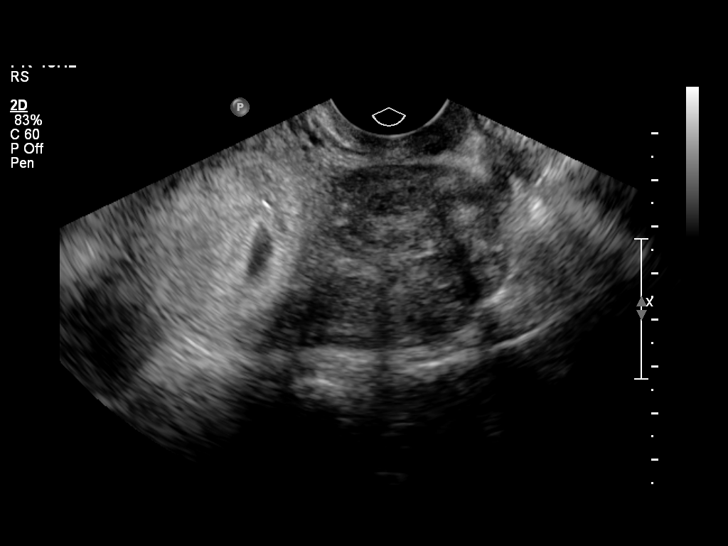
[im 24/49]
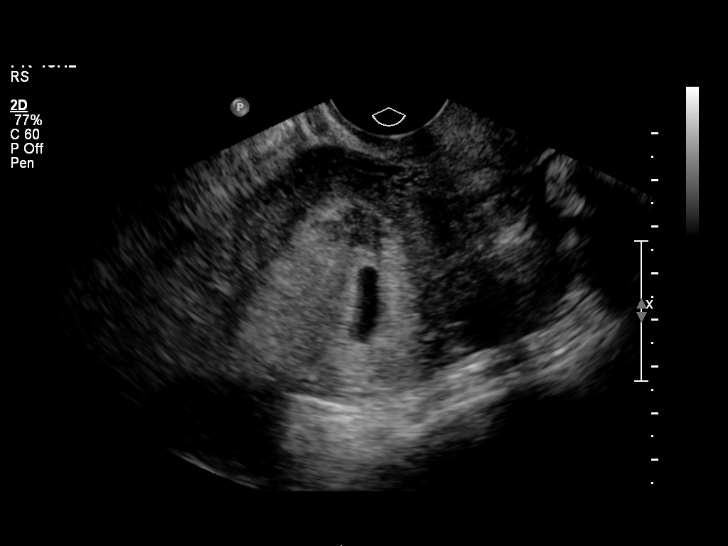
[im 27/49]
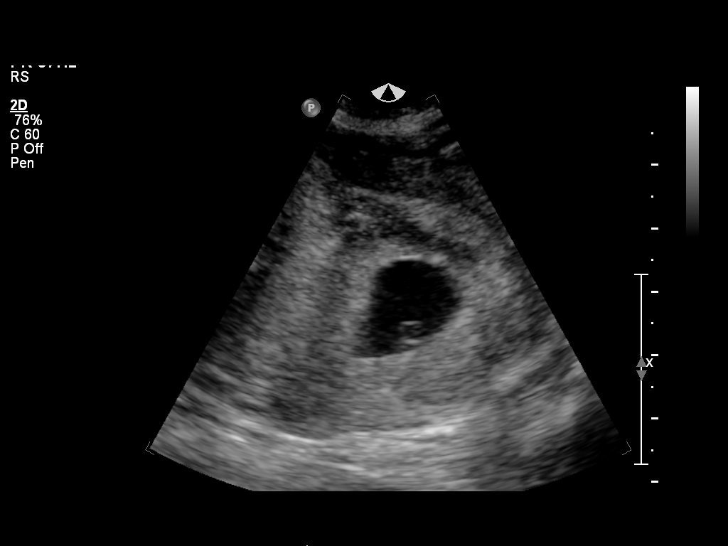
[im 31/49]
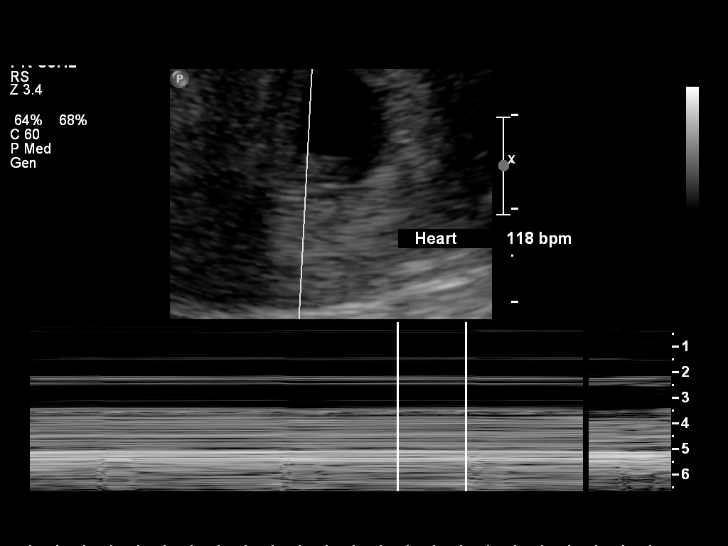
[im 34/49]
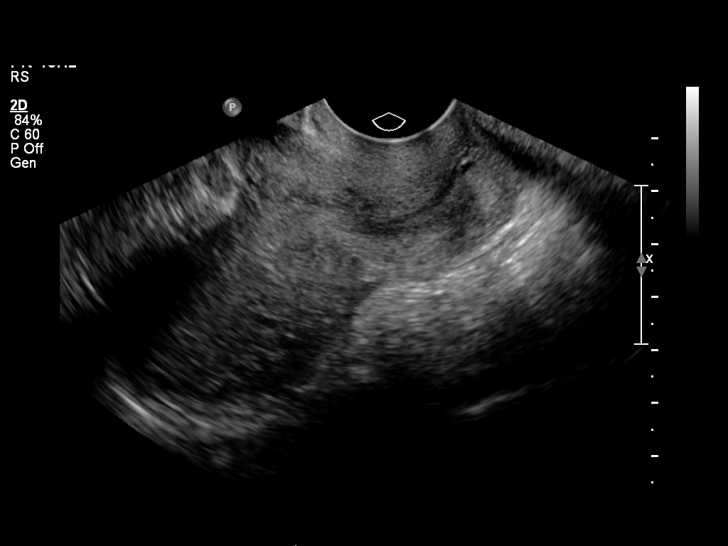
[im 38/49]
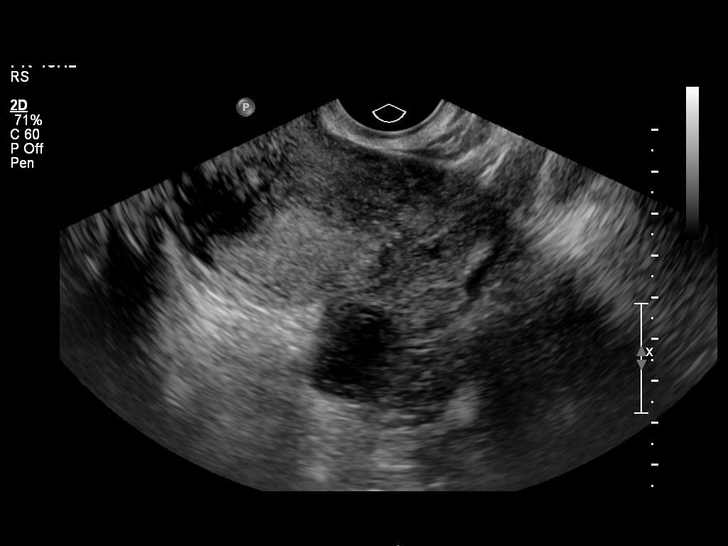
[im 41/49]
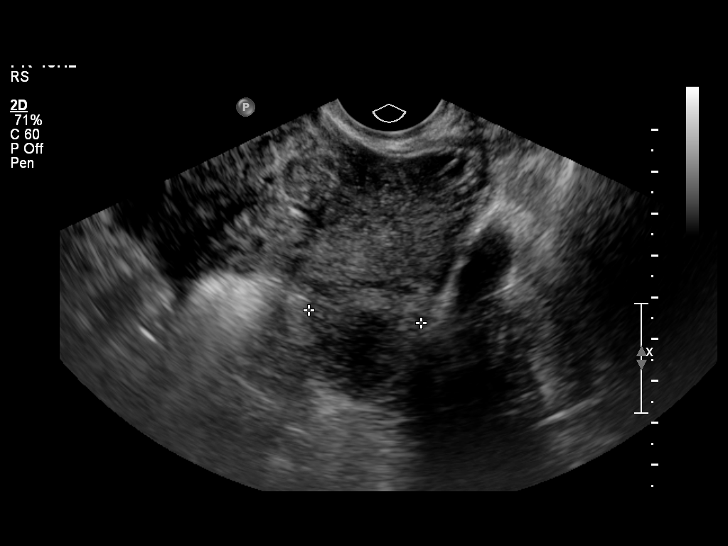
[im 45/49]
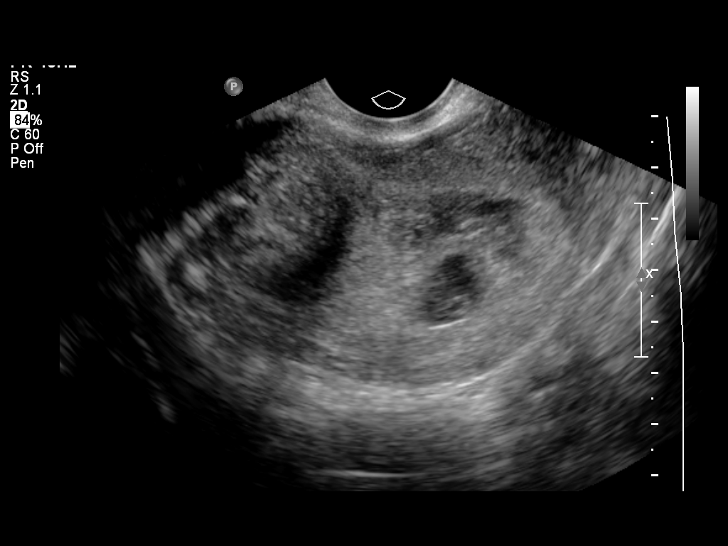
[im 49/49]
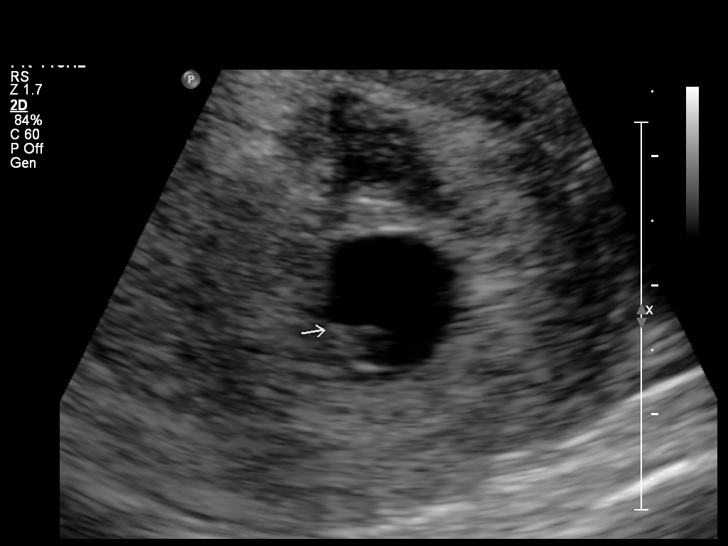

[14 of 28 positions shown; findings below may reference images not displayed]

FINDINGS: Intrauterine gestational sac: Visualized/normal in shape.

Yolk sac: Present

Embryo: Present

Cardiac Activity: Present

Heart Rate: 118 bpm

CRL: 3.3 mm 6 w 0d US EDC: 09/21/2013

Maternal uterus/adnexae: Small subchorionic hemorrhage.

Two uterine fibroids are present, measuring 4.7 x 4.0 x 4.3 cm
posteriorly and 4.4 x 4.0 x 4.7 cm anteriorly.

Right ovary is within normal limits, measuring 3.1 x 1.1 x 1.9 cm.

Left ovary is within normal limits, measuring 4.4 x 3.4 x 2.7 cm,
and is notable for a corpus luteal cyst.
IMPRESSION: Single live intrauterine gestation with estimated gestational age 6
weeks 0 days by crown-rump length.

## 2015-06-17 ENCOUNTER — Ambulatory Visit: Payer: BLUE CROSS/BLUE SHIELD | Admitting: Family Medicine

## 2015-08-28 ENCOUNTER — Ambulatory Visit: Payer: BLUE CROSS/BLUE SHIELD | Admitting: Family Medicine

## 2015-08-29 ENCOUNTER — Ambulatory Visit (INDEPENDENT_AMBULATORY_CARE_PROVIDER_SITE_OTHER): Payer: BLUE CROSS/BLUE SHIELD | Admitting: Family Medicine

## 2015-08-29 ENCOUNTER — Encounter: Payer: Self-pay | Admitting: Family Medicine

## 2015-08-29 ENCOUNTER — Ambulatory Visit: Payer: BLUE CROSS/BLUE SHIELD | Admitting: Family Medicine

## 2015-08-29 VITALS — BP 117/78 | HR 87 | Temp 98.5°F | Resp 20 | Wt 131.2 lb

## 2015-08-29 DIAGNOSIS — L03011 Cellulitis of right finger: Secondary | ICD-10-CM

## 2015-08-29 DIAGNOSIS — IMO0002 Reserved for concepts with insufficient information to code with codable children: Secondary | ICD-10-CM | POA: Insufficient documentation

## 2015-08-29 MED ORDER — MUPIROCIN 2 % EX OINT: 1.0000 "application " | TOPICAL_OINTMENT | Freq: Two times a day (BID) | CUTANEOUS | Status: DC

## 2015-08-29 MED ORDER — DOXYCYCLINE HYCLATE 100 MG PO TABS: 100.0000 mg | ORAL_TABLET | Freq: Two times a day (BID) | ORAL | Status: DC

## 2015-08-29 NOTE — Progress Notes (Signed)
Patient ID: Sharon Richards, female   DOB: 1981-04-23, 35 y.o.   MRN: [id number]    Sharon Richards , 06/25/1980, 35 y.o., female MRN: [id number]  CC: toe injury Subjective: Pt presents for an acute OV with complaints of right large toe infection during injury approximately 10 days ago. Patient states that she was reporting her toddler in trying to go up steps when she tripped and scraped her toes. She states all toes were scraped, but her large toe doesn't seem to be improving. She had soaked in Epson salt once, and was able to soften up the tissues and allowed to drain and then used a sterile blade and cut out a piece of her toenail. She states it has been throbbing and red, and it's worse at the end of the day after being on her feet. She does admit to drainage. SHe is not a diabetic.   Allergies  Allergen Reactions  . Penicillins Rash   Social History  Substance Use Topics  . Smoking status: Never Smoker   . Smokeless tobacco: Never Used  . Alcohol Use: Yes   Past Medical History  Diagnosis Date  . ADHD (attention deficit hyperactivity disorder)     dx'd in middle school, has always been on adderall  . Fibroid    Past Surgical History  Procedure Laterality Date  . Liposuction  2003  . Dilation and curettage of uterus  2002  . Cesarean section N/A 09/23/2013    Procedure: CESAREAN SECTION;  Surgeon: Cyril Mourning, MD;  Location: [facility];  Service: Obstetrics;  Laterality: N/A;   Family History  Problem Relation Age of Onset  . Heart disease Maternal Grandmother   . Asthma Sister   . Colon cancer Neg Hx   . Breast cancer Neg Hx      Medication List       This list is accurate as of: 08/29/15 10:59 AM.  Always use your most recent med list.               amphetamine-dextroamphetamine 10 MG tablet  Commonly known as:  ADDERALL  1 tab po q 2 pm     amphetamine-dextroamphetamine 30 MG 24 hr capsule  Commonly known as:  ADDERALL XR  Take 1 capsule (30 mg total) by mouth  daily.     levonorgestrel 20 MCG/24HR IUD  Commonly known as:  MIRENA  1 each by Intrauterine route once.       ROS: Negative, with the exception of above mentioned in HPI  Objective:  BP 117/78 mmHg  Pulse 87  Temp(Src) 98.5 F (36.9 C) (Oral)  Resp 20  Wt 131 lb 4 oz (59.535 kg)  SpO2 99% Body mass index is 26.28 kg/(m^2). Gen: Afebrile. No acute distress. On toxic in appearance, well-developed, well-nourished, Caucasian female. Very pleasant. HENT: AT. . MMM, no oral lesions.  Eyes:Pupils Equal Round Reactive to light, Extraocular movements intact,  Conjunctiva without redness, discharge or icterus. CV: RRRs Skin: No rashes, purpura or petechiae. Right great toe with mild erythema, swelling and drainage of the lateral aspect of the toenail. Mild tenderness to palpation. Neuro: Normal gait. PERLA. EOMi. Alert. Oriented x3   Assessment/Plan: Sharon Richards is a 35 y.o. female present for acute OV for  1. Paronychia, right - It appears patient has created paronychia either from her injury or her removal of her toe nail piece. Patient is allergic to penicillin, therefore will cover with doxycycline. Bactroban ointment. Epson soaks 2-3 times a day for  15 minutes. Keep area covered, no pressure applied to the area, caution with any type of shoe use. - doxycycline (VIBRA-TABS) 100 MG tablet; Take 1 tablet (100 mg total) by mouth 2 (two) times daily.  Dispense: 20 tablet; Refill: 0 - mupirocin ointment (BACTROBAN) 2 %; Place 1 application into the nose 2 (two) times daily.  Dispense: 22 g; Refill: 0 - Follow-up in one week sooner if not improving, discussed with her the possibility of needing to remove the sliver of the entire toenail, she was not wanting this to be completed.  electronically signed by:  Howard Pouch, DO  [place]

## 2015-08-29 NOTE — Patient Instructions (Signed)
Paronychia Paronychia is an infection of the skin that surrounds a nail. It usually affects the skin around a fingernail, but it may also occur near a toenail. It often causes pain and swelling around the nail. This condition may come on suddenly or develop over a longer period. In some cases, a collection of pus (abscess) can form near or under the nail. Usually, paronychia is not serious and it clears up with treatment. CAUSES This condition may be caused by bacteria or fungi. It is commonly caused by either Streptococcus or Staphylococcus bacteria. The bacteria or fungi often cause the infection by getting into the affected area through an opening in the skin, such as a cut or a hangnail. RISK FACTORS This condition is more likely to develop in:  People who get their hands wet often, such as those who work as dishwashers, bartenders, or nurses.  People who bite their fingernails or suck their thumbs.  People who trim their nails too short.  People who have hangnails or injured fingertips.  People who get manicures.  People who have diabetes. SYMPTOMS Symptoms of this condition include:  Redness and swelling of the skin near the nail.  Tenderness around the nail when you touch the area.  Pus-filled bumps under the cuticle. The cuticle is the skin at the base or sides of the nail.  Fluid or pus under the nail.  Throbbing pain in the area. DIAGNOSIS This condition is usually diagnosed with a physical exam. In some cases, a sample of pus may be taken from an abscess to be tested in a lab. This can help to determine what type of bacteria or fungi is causing the condition. TREATMENT Treatment for this condition depends on the cause and severity of the condition. If the condition is mild, it may clear up on its own in a few days. Your health care provider may recommend soaking the affected area in warm water a few times a day. When treatment is needed, the options may  include:  Antibiotic medicine, if the condition is caused by a bacterial infection.  Antifungal medicine, if the condition is caused by a fungal infection.  Incision and drainage, if an abscess is present. In this procedure, the health care provider will cut open the abscess so the pus can drain out. HOME CARE INSTRUCTIONS  Soak the affected area in warm water if directed to do so by your health care provider. You may be told to do this for 20 minutes, 2-3 times a day. Keep the area dry in between soakings.  Take medicines only as directed by your health care provider.  If you were prescribed an antibiotic medicine, finish all of it even if you start to feel better.  Keep the affected area clean.  Do not try to drain a fluid-filled bump yourself.  If you will be washing dishes or performing other tasks that require your hands to get wet, wear rubber gloves. You should also wear gloves if your hands might come in contact with irritating substances, such as cleaners or chemicals.  Follow your health care provider's instructions about:  Wound care.  Bandage (dressing) changes and removal. SEEK MEDICAL CARE IF:  Your symptoms get worse or do not improve with treatment.  You have a fever or chills.  You have redness spreading from the affected area.  You have continued or increased fluid, blood, or pus coming from the affected area.  Your finger or knuckle becomes swollen or is difficult to move.     This information is not intended to replace advice given to you by your health care provider. Make sure you discuss any questions you have with your health care provider.   Document Released: 10/20/2000 Document Revised: 09/10/2014 Document Reviewed: 04/03/2014 Elsevier Interactive Patient Education 2016 [employer].   Follow up 1-2 weeks, sooner if worsening.

## 2015-09-01 ENCOUNTER — Ambulatory Visit (INDEPENDENT_AMBULATORY_CARE_PROVIDER_SITE_OTHER): Payer: BLUE CROSS/BLUE SHIELD | Admitting: Family Medicine

## 2015-09-01 ENCOUNTER — Encounter: Payer: Self-pay | Admitting: Family Medicine

## 2015-09-01 VITALS — BP 136/89 | HR 72 | Temp 98.6°F | Resp 16 | Ht 59.25 in | Wt 132.2 lb

## 2015-09-01 DIAGNOSIS — L600 Ingrowing nail: Secondary | ICD-10-CM

## 2015-09-01 DIAGNOSIS — F900 Attention-deficit hyperactivity disorder, predominantly inattentive type: Secondary | ICD-10-CM | POA: Diagnosis not present

## 2015-09-01 DIAGNOSIS — F909 Attention-deficit hyperactivity disorder, unspecified type: Secondary | ICD-10-CM

## 2015-09-01 MED ORDER — AMPHETAMINE-DEXTROAMPHET ER 30 MG PO CP24: 30.0000 mg | ORAL_CAPSULE | Freq: Every day | ORAL | Status: DC

## 2015-09-01 MED ORDER — AMPHETAMINE-DEXTROAMPHETAMINE 10 MG PO TABS: ORAL_TABLET | ORAL | Status: DC

## 2015-09-01 NOTE — Progress Notes (Signed)
OFFICE VISIT  09/01/2015   CC:  Chief Complaint  Patient presents with  . Follow-up     HPI:    Patient is a 35 y.o. Caucasian female who presents for 3 mo f/u adult ADHD.   Doing well on current med regimen, no side effects. TAkes med daily.  Also wants me to look at her ingrown toenail on R foot.  She was in the office recently and saw Dr. Raoul Pitch and was rx'd doxy, bactroban, and given soaking instructions.   She showed me a picture of what the toe looked like at that time and it is SIGNIFICANTLY improved.  She wants to start working out again.  Past Medical History  Diagnosis Date  . ADHD (attention deficit hyperactivity disorder)     dx'd in middle school, has always been on adderall  . Fibroid     Past Surgical History  Procedure Laterality Date  . Liposuction  2003  . Dilation and curettage of uterus  2002  . Cesarean section N/A 09/23/2013    Procedure: CESAREAN SECTION;  Surgeon: Cyril Mourning, MD;  Location: [facility];  Service: Obstetrics;  Laterality: N/A;    Outpatient Prescriptions Prior to Visit  Medication Sig Dispense Refill  . doxycycline (VIBRA-TABS) 100 MG tablet Take 1 tablet (100 mg total) by mouth 2 (two) times daily. 20 tablet 0  . levonorgestrel (MIRENA) 20 MCG/24HR IUD 1 each by Intrauterine route once.    . mupirocin ointment (BACTROBAN) 2 % Place 1 application into the nose 2 (two) times daily. 22 g 0  . amphetamine-dextroamphetamine (ADDERALL XR) 30 MG 24 hr capsule Take 1 capsule (30 mg total) by mouth daily. 30 capsule 0  . amphetamine-dextroamphetamine (ADDERALL) 10 MG tablet 1 tab po q 2 pm 30 tablet 0   No facility-administered medications prior to visit.    Allergies  Allergen Reactions  . Penicillins Rash    ROS As per HPI  PE: Blood pressure 136/89, pulse 72, temperature 98.6 F (37 C), temperature source Oral, resp. rate 16, height 4' 11.25" (1.505 m), weight 132 lb 4 oz (59.988 kg), SpO2 95 %, not currently  breastfeeding. Wt Readings from Last 2 Encounters:  09/01/15 132 lb 4 oz (59.988 kg)  08/29/15 131 lb 4 oz (59.535 kg)    Gen: alert, oriented x 4, affect pleasant.  Lucid thinking and conversation noted. HEENT: PERRLA, EOMI.   Neck: no LAD, mass, or thyromegaly. CV: RRR, no m/r/g LUNGS: CTA bilat, nonlabored. NEURO: no tremor or tics noted on observation.  Coordination intact. CN 2-12 grossly intact bilaterally, strength 5/5 in all extremeties.  No ataxia. Right foot: great toenail with no swelling of the soft tissue surrounding nail borders.  Minimal pinkish erythema medially and towards nail base, no significant tenderness to palpation.  No streaking.  LABS:  None today  IMPRESSION AND PLAN:  1) Adult ADHD; The current medical regimen is effective;  continue present plan and medications. I printed rx's for adderall XR 30mg  1 qd, #30 and adderall IR 10mg  q2pm daily, #30 today for the next 3 months.  Appropriate fill on/after date was noted on each rx.  Her RF dates for the XR and IR formulation do not coincide, but I think we got the dates on her rx's to allow perfectly for 30d supplies.  2) Right ingrown toenail with recent infection;  much improved. Finish doxy, bactroban, and soaks. May return to workouts when toe feels ready.  An After Visit Summary was printed  and given to the patient.  FOLLOW UP: Return in about 3 months (around 12/01/2015) for f/u adult ADHD.  Signed:  Crissie Sickles, MD           09/01/2015

## 2015-09-01 NOTE — Progress Notes (Signed)
Pre visit review using our clinic review tool, if applicable. No additional management support is needed unless otherwise documented below in the visit note. 

## 2015-09-22 ENCOUNTER — Ambulatory Visit (INDEPENDENT_AMBULATORY_CARE_PROVIDER_SITE_OTHER): Payer: BLUE CROSS/BLUE SHIELD | Admitting: Family Medicine

## 2015-09-22 ENCOUNTER — Encounter: Payer: Self-pay | Admitting: Family Medicine

## 2015-09-22 VITALS — BP 107/78 | HR 87 | Temp 98.3°F | Resp 16 | Ht 59.25 in | Wt 131.8 lb

## 2015-09-22 DIAGNOSIS — J340 Abscess, furuncle and carbuncle of nose: Secondary | ICD-10-CM | POA: Diagnosis not present

## 2015-09-22 MED ORDER — HYDROCODONE-ACETAMINOPHEN 5-325 MG PO TABS: 1.0000 | ORAL_TABLET | Freq: Four times a day (QID) | ORAL | Status: DC | PRN

## 2015-09-22 MED ORDER — SULFAMETHOXAZOLE-TRIMETHOPRIM 800-160 MG PO TABS: 1.0000 | ORAL_TABLET | Freq: Two times a day (BID) | ORAL | Status: DC

## 2015-09-22 NOTE — Patient Instructions (Signed)
Apply bactroban ointment three times a day to the affected area.

## 2015-09-22 NOTE — Progress Notes (Signed)
Pre visit review using our clinic review tool, if applicable. No additional management support is needed unless otherwise documented below in the visit note. 

## 2015-09-22 NOTE — Progress Notes (Addendum)
OFFICE VISIT  09/22/2015   CC:  Chief Complaint  Patient presents with  . Nose Problem    left side x 1 day   HPI:    Patient is a 35 y.o. Caucasian female who presents for 24h of feeling like she had something swollen and painful on left side of nose.  Pressure got worse last night and some pus came out of a spot inside L nostril.  The pain is impairing her sleep.  No fever. No malaise.    Past Medical History  Diagnosis Date  . ADHD (attention deficit hyperactivity disorder)     dx'd in middle school, has always been on adderall  . Fibroid     Past Surgical History  Procedure Laterality Date  . Liposuction  2003  . Dilation and curettage of uterus  2002  . Cesarean section N/A 09/23/2013    Procedure: CESAREAN SECTION;  Surgeon: Cyril Mourning, MD;  Location: [facility];  Service: Obstetrics;  Laterality: N/A;   MEDS: Adderall XR 30mg  qd, Adderall IR 10mg  qd, Mirena IUD  Allergies  Allergen Reactions  . Penicillins Rash    ROS As per HPI  PE: Blood pressure 107/78, pulse 87, temperature 98.3 F (36.8 C), temperature source Oral, resp. rate 16, height 4' 11.25" (1.505 m), weight 131 lb 12 oz (59.761 kg), SpO2 100 %, not currently breastfeeding. Gen: Alert, well appearing.  Patient is oriented to person, place, time, and situation. AFFECT: pleasant, lucid thought and speech. Nose: mild swelling in distal aspect of L nostril near tip of nose.  No erythema or warmth.  +TTP in this region. Irritated/erythematous papular lesion inside nose in this area, no active drainage but closed drainage tract noted in this spot.  LABS:  none  IMPRESSION AND PLAN:  Nasal abscess, partially drained.  No sign of systemic illness. Will treat with bactroban (which pt says she still has at home from past toe infection), bactrim DS, and vicodin5/325. Signs/symptoms to call or return for were reviewed and pt expressed understanding.  An After Visit Summary was printed and given to the  patient.  FOLLOW UP: Return in about 4 days (around 09/26/2015) for f/u nasal infection.  Signed:  Crissie Sickles, MD           09/22/2015   ADDENDUM: pt called back today (09/23/15) stating her nose is getting worse, so I have ordered a referral to ENT for I&D of her nasal abscess.--PM

## 2015-09-23 ENCOUNTER — Telehealth: Payer: Self-pay | Admitting: Family Medicine

## 2015-09-23 DIAGNOSIS — J340 Abscess, furuncle and carbuncle of nose: Secondary | ICD-10-CM | POA: Insufficient documentation

## 2015-09-23 NOTE — Addendum Note (Signed)
Addended by: Tammi Sou on: 09/23/2015 12:10 PM   Modules accepted: Orders

## 2015-09-23 NOTE — Telephone Encounter (Signed)
Per Dr. Anitra Lauth pt needs to see ENT. Diane spoke to pt to see if she wanted to go to [facility]. Pt stated that she will call to see if she can get an apt and if she can not she will call back.

## 2015-09-23 NOTE — Telephone Encounter (Signed)
Patient states her nose has gotten worse & is not draining very well.

## 2015-09-24 HISTORY — PX: OTHER SURGICAL HISTORY: SHX169

## 2015-09-25 ENCOUNTER — Encounter: Payer: Self-pay | Admitting: Family Medicine

## 2015-09-26 ENCOUNTER — Ambulatory Visit: Payer: BLUE CROSS/BLUE SHIELD | Admitting: Family Medicine

## 2015-11-21 ENCOUNTER — Ambulatory Visit: Payer: BLUE CROSS/BLUE SHIELD | Admitting: Family Medicine

## 2015-11-24 ENCOUNTER — Ambulatory Visit: Payer: BLUE CROSS/BLUE SHIELD | Admitting: Family Medicine

## 2015-11-25 ENCOUNTER — Telehealth: Payer: Self-pay | Admitting: *Deleted

## 2015-11-25 ENCOUNTER — Ambulatory Visit (INDEPENDENT_AMBULATORY_CARE_PROVIDER_SITE_OTHER): Payer: BLUE CROSS/BLUE SHIELD | Admitting: Family Medicine

## 2015-11-25 ENCOUNTER — Other Ambulatory Visit: Payer: Self-pay | Admitting: Family Medicine

## 2015-11-25 ENCOUNTER — Encounter: Payer: Self-pay | Admitting: Family Medicine

## 2015-11-25 VITALS — BP 111/80 | HR 79 | Temp 98.5°F | Resp 16 | Ht 59.25 in | Wt 128.8 lb

## 2015-11-25 DIAGNOSIS — F909 Attention-deficit hyperactivity disorder, unspecified type: Secondary | ICD-10-CM

## 2015-11-25 DIAGNOSIS — L02419 Cutaneous abscess of limb, unspecified: Secondary | ICD-10-CM | POA: Diagnosis not present

## 2015-11-25 DIAGNOSIS — F900 Attention-deficit hyperactivity disorder, predominantly inattentive type: Secondary | ICD-10-CM | POA: Diagnosis not present

## 2015-11-25 MED ORDER — AMPHETAMINE-DEXTROAMPHETAMINE 10 MG PO TABS: ORAL_TABLET | ORAL | Status: DC

## 2015-11-25 MED ORDER — MUPIROCIN 2 % EX OINT: 1.0000 "application " | TOPICAL_OINTMENT | Freq: Three times a day (TID) | CUTANEOUS | Status: DC

## 2015-11-25 MED ORDER — TRAMADOL HCL 50 MG PO TABS: 50.0000 mg | ORAL_TABLET | Freq: Three times a day (TID) | ORAL | Status: DC | PRN

## 2015-11-25 MED ORDER — AMPHETAMINE-DEXTROAMPHET ER 30 MG PO CP24: 30.0000 mg | ORAL_CAPSULE | Freq: Every day | ORAL | Status: DC

## 2015-11-25 NOTE — Patient Instructions (Signed)
Pack wound with iodoform gauze, apply bactroban, and cover with gauze once daily.

## 2015-11-25 NOTE — Telephone Encounter (Signed)
Pt advised and voiced understanding.  [facility].

## 2015-11-25 NOTE — Telephone Encounter (Signed)
Tell her to take ibuprofen otc--3 tabs every 8 hours for the next 5 days (with food). Additionally, I'll send in some tramadol to her pharmacy.-thx

## 2015-11-25 NOTE — Progress Notes (Signed)
OFFICE VISIT  11/25/2015   CC:  Chief Complaint  Patient presents with  . Follow-up  . Cyst    underarm left side x 4 days   HPI:    Patient is a 35 y.o. Caucasian female who presents for 3 mo f/u adult ADHD.  Also, has left axillary cyst that developed 4-5 days ago.  She has applied warm compresses, applied otc benzoyl peroxide.  Nothing has drained out of it.  No fevers.  No malaise.  All is fine with her ADHD: focus/conc/motivation all good on current doses of adderall xr and adderal IR. No adverse side effects.   Past Medical History  Diagnosis Date  . ADHD (attention deficit hyperactivity disorder)     dx'd in middle school, has always been on adderall  . Fibroid     Past Surgical History  Procedure Laterality Date  . Liposuction  2003  . Dilation and curettage of uterus  2002  . Cesarean section N/A 09/23/2013    Procedure: CESAREAN SECTION;  Surgeon: Cyril Mourning, MD;  Location: [facility];  Service: Obstetrics;  Laterality: N/A;  . Incision and drainage of nasal abscess  09/24/15    Dr. Wilburn Cornelia    Outpatient Prescriptions Prior to Visit  Medication Sig Dispense Refill  . levonorgestrel (MIRENA) 20 MCG/24HR IUD 1 each by Intrauterine route once.    Marland Kitchen amphetamine-dextroamphetamine (ADDERALL XR) 30 MG 24 hr capsule Take 1 capsule (30 mg total) by mouth daily. 30 capsule 0  . amphetamine-dextroamphetamine (ADDERALL) 10 MG tablet 1 tab po q 2 pm 30 tablet 0  . HYDROcodone-acetaminophen (NORCO/VICODIN) 5-325 MG tablet Take 1 tablet by mouth every 6 (six) hours as needed for moderate pain. (Patient not taking: Reported on 11/25/2015) 30 tablet 0  . sulfamethoxazole-trimethoprim (BACTRIM DS,SEPTRA DS) 800-160 MG tablet Take 1 tablet by mouth 2 (two) times daily. (Patient not taking: Reported on 11/25/2015) 14 tablet 0   No facility-administered medications prior to visit.    Allergies  Allergen Reactions  . Penicillins Rash    ROS As per HPI  PE: Blood  pressure 111/80, pulse 79, temperature 98.5 F (36.9 C), temperature source Oral, resp. rate 16, height 4' 11.25" (1.505 m), weight 128 lb 12 oz (58.401 kg), SpO2 100 %. Wt Readings from Last 2 Encounters:  11/25/15 128 lb 12 oz (58.401 kg)  09/22/15 131 lb 12 oz (59.761 kg)    Gen: alert, oriented x 4, affect pleasant.  Lucid thinking and conversation noted. HEENT: PERRLA, EOMI.   Neck: no LAD, mass, or thyromegaly. CV: RRR, no m/r/g LUNGS: CTA bilat, nonlabored. NEURO: no tremor or tics noted on observation.  Coordination intact. CN 2-12 grossly intact bilaterally, strength 5/5 in all extremeties.  No ataxia. Left axilla: multilobular, indurated subQ nodular lesions c/w cyst vs abscesses.  One measures about 3 cm x 2 cm and one measures about 2 cm x 2 cm.  These are mildly tender to palpation.  No drainage tracts are noted.  No significant erythema or skin warmth.  LABS:  none  IMPRESSION AND PLAN:  1) Adult ADHD: The current medical regimen is effective;  continue present plan and medications. I printed rx's for adderall XR 30mg  qd, #30 and adderall IR 10mg  qd, #30 today for this month, August 2017, and September 2017.  Appropriate fill on/after date was noted on each rx.  2) Left axillary abscess.  Pt has hx of MRSA abscess in nasal passage that had to be I&D'd by ENT. Discussed  options today and pt chose to have this I&D'd today in the office.  Procedure: Incision and drainage of left axillary abscess/cyst.  The indication for the procedure was explained to the patient, benefits and risks of procedure were outlined for patient, patient agreed to proceed.  Steps of the procedure were clearly explained to the patient prior to starting. Injected lesion with 3 ml of 1% lidocaine with epi for local anesthesia.  Incised central portion of each of the two lesions with scalpel and used manual pressure and hemostats to express contents and encourage complete drainage.  Culture swab of lesion  contents obtained and sent to lab.  Wound packed with iodoform gauze and dressed.  No bleeding.  Patient tolerated procedure well.  No immediate complications.  Wound care instructions given.  Warning signs of infection discussed. Follow up discussed.  Call or return for problems.  Due to her work schedule, she is unable to f/u as I wanted in 3 d. She will monitor wound, do home packing and dressing, and f/u in about 10d. No abx rx'd today.  At f/u visit we'll discuss MRSA decolonization attempt with either bleach water baths or bactroban regimen.  FOLLOW UP: Return in about 2 weeks (around 12/09/2015) for f/u L axillary wound.  Signed:  Crissie Sickles, MD           11/25/2015

## 2015-11-25 NOTE — Telephone Encounter (Signed)
Pt LMOM on 11/25/15 at 1:10pm stating that her underarm is hurting/trobbing and wants to know what she can take or if something can be called in. Please advise. Thanks.

## 2015-11-25 NOTE — Progress Notes (Signed)
Pre visit review using our clinic review tool, if applicable. No additional management support is needed unless otherwise documented below in the visit note. 

## 2015-11-25 NOTE — Telephone Encounter (Signed)
Left message for pt to call back. Need to know what pharmacy she would like Rx for tramadol sent to.

## 2015-11-27 ENCOUNTER — Encounter: Payer: Self-pay | Admitting: Family Medicine

## 2015-11-27 ENCOUNTER — Ambulatory Visit (INDEPENDENT_AMBULATORY_CARE_PROVIDER_SITE_OTHER): Payer: BLUE CROSS/BLUE SHIELD | Admitting: Family Medicine

## 2015-11-27 ENCOUNTER — Telehealth: Payer: Self-pay | Admitting: *Deleted

## 2015-11-27 VITALS — BP 114/74 | HR 67 | Temp 98.4°F | Resp 16 | Ht 59.25 in | Wt 128.0 lb

## 2015-11-27 DIAGNOSIS — L02412 Cutaneous abscess of left axilla: Secondary | ICD-10-CM

## 2015-11-27 MED ORDER — DOXYCYCLINE HYCLATE 100 MG PO TABS: 100.0000 mg | ORAL_TABLET | Freq: Two times a day (BID) | ORAL | Status: DC

## 2015-11-27 MED ORDER — PROMETHAZINE HCL 12.5 MG PO TABS: ORAL_TABLET | ORAL | Status: DC

## 2015-11-27 MED ORDER — HYDROCODONE-ACETAMINOPHEN 5-325 MG PO TABS: 1.0000 | ORAL_TABLET | Freq: Four times a day (QID) | ORAL | Status: DC | PRN

## 2015-11-27 NOTE — Progress Notes (Signed)
Pre visit review using our clinic review tool, if applicable. No additional management support is needed unless otherwise documented below in the visit note. 

## 2015-11-27 NOTE — Progress Notes (Signed)
OFFICE VISIT  11/27/2015   CC:  Chief Complaint  Patient presents with  . Follow-up    I&D    HPI:    Patient is a 35 y.o. Caucasian female who presents for pain and drainage from left axilla where I did I&D of an abscess 2 days ago.  Feels dull throbbing, feels like it is full, has been draining some today. She changed her bandage/packing a few hours ago--says pus noted.  No fever. Tramadol is causing too much GI upset to use much.   Past Medical History  Diagnosis Date  . ADHD (attention deficit hyperactivity disorder)     dx'd in middle school, has always been on adderall  . Fibroid     Past Surgical History  Procedure Laterality Date  . Liposuction  2003  . Dilation and curettage of uterus  2002  . Cesarean section N/A 09/23/2013    Procedure: CESAREAN SECTION;  Surgeon: Cyril Mourning, MD;  Location: [facility];  Service: Obstetrics;  Laterality: N/A;  . Incision and drainage of nasal abscess  09/24/15    Dr. Wilburn Cornelia ([facility])    Outpatient Prescriptions Prior to Visit  Medication Sig Dispense Refill  . amphetamine-dextroamphetamine (ADDERALL XR) 30 MG 24 hr capsule Take 1 capsule (30 mg total) by mouth daily. 30 capsule 0  . amphetamine-dextroamphetamine (ADDERALL) 10 MG tablet 1 tab po q 2 pm 30 tablet 0  . clindamycin (CLINDAGEL) 1 % gel Apply 1 application topically every morning.  11  . levonorgestrel (MIRENA) 20 MCG/24HR IUD 1 each by Intrauterine route once.    . mupirocin ointment (BACTROBAN) 2 % Apply 1 application topically 3 (three) times daily. 15 g 1  . tretinoin (RETIN-A) 0.05 % cream Apply 1 application topically at bedtime.  11  . traMADol (ULTRAM) 50 MG tablet Take 1 tablet (50 mg total) by mouth every 8 (eight) hours as needed. 30 tablet 0   No facility-administered medications prior to visit.    Allergies  Allergen Reactions  . Penicillins Rash    ROS As per HPI  PE: Blood pressure 114/74, pulse 67, temperature 98.4 F (36.9 C),  temperature source Oral, resp. rate 16, height 4' 11.25" (1.505 m), weight 128 lb (58.06 kg), SpO2 100 %. Gen: Alert, well appearing.  Patient is oriented to person, place, time, and situation. Left axilla: wound with a small perimeter of pinkish- hued skin but no erythema or warmth.   The iodoform gauze had some serosanguinous drainage.  Wound pocket had granulation tissue. Significant induration of perimeter of former abscess pocket.  Significantly tender to palpation.  LABS:  None today. Wound culture preliminary result shows staph, but no further identification available yet.  IMPRESSION AND PLAN:  Left axillary abscess, day 2 s/p I&D. Definitely has some edematous tissue but I don't discern any residual pocket of abscess fluid on exam. Will get doxy 100mg  bid x 10d started. Change pain med to vicodin 5/325, which she says she has tolerated in the past. Phenergan 12.5, 1-2 tabs q6h prn nausea.  An After Visit Summary was printed and given to the patient.  FOLLOW UP: Return for keep appt set for 12/05/15.  Signed:  Crissie Sickles, MD           11/27/2015

## 2015-11-27 NOTE — Telephone Encounter (Signed)
Pls call pt and ask her to come in today or tomorrow so I can examine the area.

## 2015-11-27 NOTE — Telephone Encounter (Signed)
Pt advised. Apt made for 2:30pm today.

## 2015-11-27 NOTE — Telephone Encounter (Signed)
Pt called today stating that she is having pus drainage from her incision area under her arm. She stated that she is also having more pain in that area. She stated that the tramadol upsets her stomach so she had not been taking that too much. She is concerned about infection and wants to know if she needs an antibiotic. Please advise. Thanks. [facility]

## 2015-11-28 ENCOUNTER — Other Ambulatory Visit: Payer: Self-pay | Admitting: Family Medicine

## 2015-11-28 LAB — WOUND CULTURE: GRAM STAIN: NONE SEEN

## 2015-11-28 MED ORDER — SULFAMETHOXAZOLE-TRIMETHOPRIM 800-160 MG PO TABS: 1.0000 | ORAL_TABLET | Freq: Two times a day (BID) | ORAL | Status: DC

## 2015-12-05 ENCOUNTER — Encounter: Payer: Self-pay | Admitting: Family Medicine

## 2015-12-05 ENCOUNTER — Ambulatory Visit (INDEPENDENT_AMBULATORY_CARE_PROVIDER_SITE_OTHER): Payer: BLUE CROSS/BLUE SHIELD | Admitting: Family Medicine

## 2015-12-05 VITALS — BP 107/73 | HR 106 | Temp 98.4°F | Resp 16 | Ht 59.25 in | Wt 128.0 lb

## 2015-12-05 DIAGNOSIS — L02419 Cutaneous abscess of limb, unspecified: Secondary | ICD-10-CM

## 2015-12-05 NOTE — Progress Notes (Signed)
Pre visit review using our clinic review tool, if applicable. No additional management support is needed unless otherwise documented below in the visit note. 

## 2015-12-05 NOTE — Progress Notes (Signed)
OFFICE VISIT  12/05/2015   CC:  Chief Complaint  Patient presents with  . Follow-up    Left axiallary I&D     HPI:    Patient is a 36 y.o. Caucasian female who presents for 8 day f/u I& D of left axillary abscess. Has been on bactrim b/c c/s from wound culture showed sensitivity to this but resistance to doxy. It is not hurting her.  No fever or malaise.  Tolerating bactrim fine.   No wound d/c.  It is almost sealed up.   Past Medical History:  Diagnosis Date  . ADHD (attention deficit hyperactivity disorder)    dx'd in middle school, has always been on adderall  . Fibroid     Past Surgical History:  Procedure Laterality Date  . CESAREAN SECTION N/A 09/23/2013   Procedure: CESAREAN SECTION;  Surgeon: Cyril Mourning, MD;  Location: [facility];  Service: Obstetrics;  Laterality: N/A;  . DILATION AND CURETTAGE OF UTERUS  2002  . incision and drainage of nasal abscess  09/24/15   Dr. Wilburn Cornelia ([facility])  . LIPOSUCTION  2003    Outpatient Medications Prior to Visit  Medication Sig Dispense Refill  . amphetamine-dextroamphetamine (ADDERALL XR) 30 MG 24 hr capsule Take 1 capsule (30 mg total) by mouth daily. 30 capsule 0  . amphetamine-dextroamphetamine (ADDERALL) 10 MG tablet 1 tab po q 2 pm 30 tablet 0  . clindamycin (CLINDAGEL) 1 % gel Apply 1 application topically every morning.  11  . HYDROcodone-acetaminophen (NORCO/VICODIN) 5-325 MG tablet Take 1-2 tablets by mouth every 6 (six) hours as needed for moderate pain. 30 tablet 0  . levonorgestrel (MIRENA) 20 MCG/24HR IUD 1 each by Intrauterine route once.    . mupirocin ointment (BACTROBAN) 2 % Apply 1 application topically 3 (three) times daily. 15 g 1  . promethazine (PHENERGAN) 12.5 MG tablet 1-2 tabs po q6h prn nausea 30 tablet 0  . sulfamethoxazole-trimethoprim (BACTRIM DS,SEPTRA DS) 800-160 MG tablet Take 1 tablet by mouth 2 (two) times daily. 20 tablet 0  . tretinoin (RETIN-A) 0.05 % cream Apply 1 application topically at  bedtime.  11   No facility-administered medications prior to visit.     Allergies  Allergen Reactions  . Penicillins Rash    ROS As per HPI  PE: Blood pressure 107/73, pulse (!) 106, temperature 98.4 F (36.9 C), temperature source Oral, resp. rate 16, height 4' 11.25" (1.505 m), weight 128 lb (58.1 kg), SpO2 100 %. Gen: Alert, well appearing.  Patient is oriented to person, place, time, and situation. AFFECT: pleasant, lucid thought and speech. Left axilla with small skin opening into shallow wound-- cotton tip of Q tip will not go all the way down into wound.   No fluctuance or nodule or induration is palpable.  No tenderness or exudate.  No erythema.  LABS:  none  IMPRESSION AND PLAN:  Left axillary MRSA abscess. Resolving appropriately.  Finish bactrim. Cover until wound is sealed up completely. We discussed use of bactroban for MRSA eradication purposes: apply thin film bid to both nares, both axillae, both groin creases, and gluteal cleft x 1 week for 3 consecutive months.  An After Visit Summary was printed and given to the patient.  FOLLOW UP: No Follow-up on file.  Signed:  Crissie Sickles, MD           12/05/2015

## 2016-01-09 ENCOUNTER — Telehealth: Payer: Self-pay | Admitting: Family Medicine

## 2016-01-09 MED ORDER — SCOPOLAMINE 1 MG/3DAYS TD PT72: 1.0000 | MEDICATED_PATCH | TRANSDERMAL | 1 refills | Status: DC

## 2016-01-09 NOTE — Telephone Encounter (Signed)
Patient is going on cruise early Oct. Can she get an Rx for motion sickness?

## 2016-01-09 NOTE — Telephone Encounter (Signed)
Tried to contact patient, no answer. Will try again later.

## 2016-01-09 NOTE — Telephone Encounter (Signed)
Please advise 

## 2016-01-09 NOTE — Telephone Encounter (Signed)
Scopolamine patch eRx'd. 

## 2016-01-13 NOTE — Telephone Encounter (Signed)
Still unable to leave message on pt's cell.

## 2016-02-20 ENCOUNTER — Ambulatory Visit (INDEPENDENT_AMBULATORY_CARE_PROVIDER_SITE_OTHER): Payer: BLUE CROSS/BLUE SHIELD | Admitting: Family Medicine

## 2016-02-20 ENCOUNTER — Encounter: Payer: Self-pay | Admitting: Family Medicine

## 2016-02-20 VITALS — BP 118/86 | HR 98 | Temp 98.0°F | Resp 18 | Wt 129.4 lb

## 2016-02-20 DIAGNOSIS — F909 Attention-deficit hyperactivity disorder, unspecified type: Secondary | ICD-10-CM | POA: Diagnosis not present

## 2016-02-20 DIAGNOSIS — Z23 Encounter for immunization: Secondary | ICD-10-CM | POA: Diagnosis not present

## 2016-02-20 DIAGNOSIS — L720 Epidermal cyst: Secondary | ICD-10-CM | POA: Diagnosis not present

## 2016-02-20 MED ORDER — AMPHETAMINE-DEXTROAMPHET ER 30 MG PO CP24: 30.0000 mg | ORAL_CAPSULE | Freq: Every day | ORAL | 0 refills | Status: DC

## 2016-02-20 MED ORDER — AMPHETAMINE-DEXTROAMPHETAMINE 10 MG PO TABS: ORAL_TABLET | ORAL | 0 refills | Status: DC

## 2016-02-20 NOTE — Progress Notes (Signed)
Pre visit review using our clinic review tool, if applicable. No additional management support is needed unless otherwise documented below in the visit note. 

## 2016-02-20 NOTE — Progress Notes (Signed)
OFFICE VISIT  02/20/2016   CC:  Chief Complaint  Patient presents with  . Follow-up    refills   HPI:    Patient is a 35 y.o. Caucasian female who presents for adult ADHD f/u. Doing well with concentration and focus and motivation on current adderall xR and plain adderall dosing.  Takes meds daily, no side effects.  Has small, soft bump under skin of right forearm she noted w/in the last week or so, asks me to look at it.  No pain, no skin changes.   Past Medical History:  Diagnosis Date  . ADHD (attention deficit hyperactivity disorder)    dx'd in middle school, has always been on adderall  . Fibroid     Past Surgical History:  Procedure Laterality Date  . CESAREAN SECTION N/A 09/23/2013   Procedure: CESAREAN SECTION;  Surgeon: Cyril Mourning, MD;  Location: [facility];  Service: Obstetrics;  Laterality: N/A;  . DILATION AND CURETTAGE OF UTERUS  2002  . incision and drainage of nasal abscess  09/24/15   Dr. Wilburn Cornelia ([facility])  . LIPOSUCTION  2003    Outpatient Medications Prior to Visit  Medication Sig Dispense Refill  . levonorgestrel (MIRENA) 20 MCG/24HR IUD 1 each by Intrauterine route once.    . mupirocin ointment (BACTROBAN) 2 % Apply 1 application topically 3 (three) times daily. 15 g 1  . tretinoin (RETIN-A) 0.05 % cream Apply 1 application topically at bedtime.  11  . amphetamine-dextroamphetamine (ADDERALL XR) 30 MG 24 hr capsule Take 1 capsule (30 mg total) by mouth daily. 30 capsule 0  . amphetamine-dextroamphetamine (ADDERALL) 10 MG tablet 1 tab po q 2 pm 30 tablet 0  . clindamycin (CLINDAGEL) 1 % gel Apply 1 application topically every morning.  11  . HYDROcodone-acetaminophen (NORCO/VICODIN) 5-325 MG tablet Take 1-2 tablets by mouth every 6 (six) hours as needed for moderate pain. (Patient not taking: Reported on 02/20/2016) 30 tablet 0  . promethazine (PHENERGAN) 12.5 MG tablet 1-2 tabs po q6h prn nausea (Patient not taking: Reported on 02/20/2016) 30 tablet 0   . scopolamine (TRANSDERM-SCOP) 1 MG/3DAYS Place 1 patch (1.5 mg total) onto the skin every 3 (three) days. (Patient not taking: Reported on 02/20/2016) 4 patch 1  . sulfamethoxazole-trimethoprim (BACTRIM DS,SEPTRA DS) 800-160 MG tablet Take 1 tablet by mouth 2 (two) times daily. (Patient not taking: Reported on 02/20/2016) 20 tablet 0   No facility-administered medications prior to visit.     Allergies  Allergen Reactions  . Penicillins Rash    ROS As per HPI  PE: Blood pressure 118/86, pulse 98, temperature 98 F (36.7 C), temperature source Oral, resp. rate 18, weight 129 lb 6.4 oz (58.7 kg), SpO2 98 %. Wt Readings from Last 2 Encounters:  02/20/16 129 lb 6.4 oz (58.7 kg)  12/05/15 128 lb (58.1 kg)    Gen: alert, oriented x 4, affect pleasant.  Lucid thinking and conversation noted. HEENT: PERRLA, EOMI.   Neck: no LAD, mass, or thyromegaly. CV: RRR, no m/r/g LUNGS: CTA bilat, nonlabored. NEURO: no tremor or tics noted on observation.  Coordination intact. CN 2-12 grossly intact bilaterally, strength 5/5 in all extremeties.  No ataxia. Right forearm extensor surface with soft/rubbery subq nodule.  Nontender.  Overlying skin is normal.  LABS:  none  IMPRESSION AND PLAN:  1) Adult ADHD: The current medical regimen is effective;  continue present plan and medications. I printed rx's for adderall XR 30mg  qd, #30 and adderall IR 10mg  q 2PM, #  30 today for this month, November 2017, and December 2017.  Appropriate fill on/after date was noted on each rx.  2) Right forearm subQ nodule: suspect epidermal inclusion cyst. Reassured pt. Monitor.  An After Visit Summary was printed and given to the patient.  Spent 25 min with pt today, with >50% of this time spent in counseling and care coordination regarding the above problems.  FOLLOW UP: Return in about 3 months (around 05/22/2016) for routine chronic illness f/u.  Signed:  Crissie Sickles, MD           02/20/2016

## 2016-02-20 NOTE — Patient Instructions (Signed)
Right forearm: this feels like an epidermal inclusion cyst.

## 2016-05-06 ENCOUNTER — Encounter: Payer: Self-pay | Admitting: Family Medicine

## 2016-05-06 ENCOUNTER — Ambulatory Visit (INDEPENDENT_AMBULATORY_CARE_PROVIDER_SITE_OTHER): Payer: BLUE CROSS/BLUE SHIELD | Admitting: Family Medicine

## 2016-05-06 VITALS — BP 110/74 | HR 90 | Temp 98.6°F | Resp 16 | Ht 59.25 in | Wt 130.2 lb

## 2016-05-06 DIAGNOSIS — F909 Attention-deficit hyperactivity disorder, unspecified type: Secondary | ICD-10-CM | POA: Diagnosis not present

## 2016-05-06 MED ORDER — AMPHETAMINE-DEXTROAMPHETAMINE 10 MG PO TABS: ORAL_TABLET | ORAL | 0 refills | Status: DC

## 2016-05-06 MED ORDER — AMPHETAMINE-DEXTROAMPHET ER 30 MG PO CP24: 30.0000 mg | ORAL_CAPSULE | Freq: Every day | ORAL | 0 refills | Status: DC

## 2016-05-06 NOTE — Progress Notes (Signed)
OFFICE VISIT  05/06/2016   CC:  Chief Complaint  Patient presents with  . Follow-up    RCI   HPI:    Patient is a 35 y.o. Caucasian female who presents for f/u adult ADHD. Doing well on current adderall dosing: good focus, concentration, and motivation. No side effects.  Takes med daily.   No dep or anx.   Past Medical History:  Diagnosis Date  . 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  Marland Kitchen ADHD (attention deficit hyperactivity disorder)    dx'd in middle school, has always been on adderall  . Fibroid     Past Surgical History:  Procedure Laterality Date  . CESAREAN SECTION N/A 09/23/2013   Procedure: CESAREAN SECTION;  Surgeon: Cyril Mourning, MD;  Location: [facility];  Service: Obstetrics;  Laterality: N/A;  . DILATION AND CURETTAGE OF UTERUS  2002  . incision and drainage of nasal abscess  09/24/15   Dr. Wilburn Cornelia (MRSA)  . LIPOSUCTION  2003    Outpatient Medications Prior to Visit  Medication Sig Dispense Refill  . clindamycin (CLINDAGEL) 1 % gel Apply 1 application topically every morning.  11  . levonorgestrel (MIRENA) 20 MCG/24HR IUD 1 each by Intrauterine route once.    . tretinoin (RETIN-A) 0.05 % cream Apply 1 application topically at bedtime.  11  . amphetamine-dextroamphetamine (ADDERALL XR) 30 MG 24 hr capsule Take 1 capsule (30 mg total) by mouth daily. 30 capsule 0  . amphetamine-dextroamphetamine (ADDERALL) 10 MG tablet 1 tab po q 2 pm 30 tablet 0  . mupirocin ointment (BACTROBAN) 2 % Apply 1 application topically 3 (three) times daily. (Patient not taking: Reported on 05/06/2016) 15 g 1   No facility-administered medications prior to visit.     Allergies  Allergen Reactions  . Penicillins Rash    ROS As per HPI  PE: Blood pressure 110/74, pulse 90, temperature 98.6 F (37 C), temperature source Oral, resp. rate 16, height 4' 11.25" (1.505 m), weight 130 lb 4 oz (59.1 kg), SpO2 96  %. Wt Readings from Last 2 Encounters:  05/06/16 130 lb 4 oz (59.1 kg)  02/20/16 129 lb 6.4 oz (58.7 kg)    Gen: alert, oriented x 4, affect pleasant.  Lucid thinking and conversation noted. HEENT: PERRLA, EOMI.   Neck: no LAD, mass, or thyromegaly. CV: RRR, no m/r/g LUNGS: CTA bilat, nonlabored. NEURO: no tremor or tics noted on observation.  Coordination intact. CN 2-12 grossly intact bilaterally, strength 5/5 in all extremeties.  No ataxia.   LABS:  none  IMPRESSION AND PLAN:  Adult ADHD: The current medical regimen is effective;  continue present plan and medications. I printed rx's for adderall XR 30mg  qd, #30 and adderall IR 10mg  q 2 pm, #30 today for Jan, Feb, and Mar 2018.  Appropriate fill on/after date was noted on each rx.  An After Visit Summary was printed and given to the patient.  FOLLOW UP: Return in about 3 months (around 08/04/2016) for f/u ADHD.  Signed:  Crissie Sickles, MD           05/06/2016

## 2016-05-06 NOTE — Progress Notes (Signed)
Pre visit review using our clinic review tool, if applicable. No additional management support is needed unless otherwise documented below in the visit note. 

## 2016-05-10 DIAGNOSIS — O039 Complete or unspecified spontaneous abortion without complication: Secondary | ICD-10-CM

## 2016-05-10 HISTORY — DX: Complete or unspecified spontaneous abortion without complication: O03.9

## 2016-07-07 ENCOUNTER — Telehealth: Payer: Self-pay | Admitting: *Deleted

## 2016-07-07 NOTE — Telephone Encounter (Signed)
I have rx'd it two different ways in the past, so I'll have to see which SIG is on the fax request before authorizing the RF. Pls show it to me.-thx

## 2016-07-07 NOTE — Telephone Encounter (Signed)
Fax from [facility] requesting refill for Mupirocin. This is not on pts medication list. Please advise. Thanks.

## 2016-07-08 MED ORDER — MUPIROCIN 2 % EX OINT: 1.0000 "application " | TOPICAL_OINTMENT | Freq: Three times a day (TID) | CUTANEOUS | 2 refills | Status: DC

## 2016-07-08 NOTE — Telephone Encounter (Signed)
OK, signed rx.

## 2016-07-08 NOTE — Telephone Encounter (Signed)
Rx eRxed to pharmacy.  

## 2016-07-08 NOTE — Telephone Encounter (Signed)
Fax request on Dr. Idelle Leech desk.

## 2016-08-11 ENCOUNTER — Encounter: Payer: Self-pay | Admitting: Family Medicine

## 2016-08-11 ENCOUNTER — Ambulatory Visit (INDEPENDENT_AMBULATORY_CARE_PROVIDER_SITE_OTHER): Payer: BLUE CROSS/BLUE SHIELD | Admitting: Family Medicine

## 2016-08-11 VITALS — BP 115/71 | HR 85 | Temp 98.4°F | Resp 16 | Ht 59.25 in | Wt 131.2 lb

## 2016-08-11 DIAGNOSIS — K5909 Other constipation: Secondary | ICD-10-CM

## 2016-08-11 DIAGNOSIS — K644 Residual hemorrhoidal skin tags: Secondary | ICD-10-CM | POA: Diagnosis not present

## 2016-08-11 MED ORDER — HYDROCORTISONE 2.5 % RE CREA: 1.0000 "application " | TOPICAL_CREAM | Freq: Two times a day (BID) | RECTAL | 1 refills | Status: DC

## 2016-08-11 NOTE — Progress Notes (Signed)
OFFICE VISIT  08/11/2016   CC:  Chief Complaint  Patient presents with  . Hemorrhoids   HPI:    Patient is a 36 y.o. Caucasian female who presents for anal pain.  Says it looks purple. Hurts constantly now, onset about 1 week ago. Has had hemorrhoids since having her son 3 yrs ago.  Has never had to have thrombectomy. Just a few spots of blood lately.  Has not been doing any soaking.  Has had chronic constipation.  Has 1 BM per week, sometimes hard followed by loose stool.  Some difficulty with straining.  Takes a glycerin suppository about 1 time per week.  Past Medical History:  Diagnosis Date  . 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  Marland Kitchen ADHD (attention deficit hyperactivity disorder)    dx'd in middle school, has always been on adderall  . Fibroid     Past Surgical History:  Procedure Laterality Date  . CESAREAN SECTION N/A 09/23/2013   Procedure: CESAREAN SECTION;  Surgeon: Cyril Mourning, MD;  Location: [facility];  Service: Obstetrics;  Laterality: N/A;  . DILATION AND CURETTAGE OF UTERUS  2002  . incision and drainage of nasal abscess  09/24/15   Dr. Wilburn Cornelia (MRSA)  . LIPOSUCTION  2003    Outpatient Medications Prior to Visit  Medication Sig Dispense Refill  . amphetamine-dextroamphetamine (ADDERALL XR) 30 MG 24 hr capsule Take 1 capsule (30 mg total) by mouth daily. 30 capsule 0  . amphetamine-dextroamphetamine (ADDERALL) 10 MG tablet 1 tab po q 2 pm 30 tablet 0  . clindamycin (CLINDAGEL) 1 % gel Apply 1 application topically every morning.  11  . levonorgestrel (MIRENA) 20 MCG/24HR IUD 1 each by Intrauterine route once.    . mupirocin ointment (BACTROBAN) 2 % Apply 1 application topically 3 (three) times daily. 22 g 2  . tretinoin (RETIN-A) 0.05 % cream Apply 1 application topically at bedtime.  11   No facility-administered medications prior to visit.     Allergies  Allergen Reactions  .  Penicillins Rash    ROS As per HPI  PE: Blood pressure 115/71, pulse 85, temperature 98.4 F (36.9 C), temperature source Oral, resp. rate 16, height 4' 11.25" (1.505 m), weight 131 lb 4 oz (59.5 kg), SpO2 100 %.  Pt examined with Sharen Hones, CMA, as chaperone.  Gen: Alert, well appearing.  Patient is oriented to person, place, time, and situation. Anal exam: a couple of skin tags/involuted hemorrhoids are present --non inflamed and nontender. There is a tender external hemorrhoid at left lateral position that is tender and mildly firm but not firmly thrombosed. No internal hemorrhoids visualized on inspection of distal anal canal.  No blood.  LABS:  none  IMPRESSION AND PLAN:  1) External hemorrhoid, possibly early in the process of thrombosis.   Conservative therapy will be done as first step: sitz bath 20 min bid, hydrocortisone cream 2% bid.  2) Chronic constipation: start senakot S generic, OTC: 2 tabs daily.  An After Visit Summary was printed and given to the patient.  FOLLOW UP: keep ADHD f/u already set in 2d.  Signed:  Crissie Sickles, MD           08/11/2016

## 2016-08-11 NOTE — Patient Instructions (Signed)
Buy over the counter generic senakot S (sometimes called "senna plus". Take 2 tabs every day.  Buy sitz bath over the counter and sit in this 20 min twice per day.

## 2016-08-11 NOTE — Progress Notes (Signed)
Pre visit review using our clinic review tool, if applicable. No additional management support is needed unless otherwise documented below in the visit note. 

## 2016-08-13 ENCOUNTER — Encounter: Payer: Self-pay | Admitting: Family Medicine

## 2016-08-13 ENCOUNTER — Ambulatory Visit: Payer: BLUE CROSS/BLUE SHIELD | Admitting: Family Medicine

## 2016-08-13 ENCOUNTER — Ambulatory Visit (INDEPENDENT_AMBULATORY_CARE_PROVIDER_SITE_OTHER): Payer: BLUE CROSS/BLUE SHIELD | Admitting: Family Medicine

## 2016-08-13 VITALS — BP 119/87 | HR 68 | Temp 98.2°F | Resp 16 | Ht 59.25 in | Wt 130.5 lb

## 2016-08-13 DIAGNOSIS — F909 Attention-deficit hyperactivity disorder, unspecified type: Secondary | ICD-10-CM | POA: Diagnosis not present

## 2016-08-13 DIAGNOSIS — K644 Residual hemorrhoidal skin tags: Secondary | ICD-10-CM

## 2016-08-13 MED ORDER — AMPHETAMINE-DEXTROAMPHETAMINE 10 MG PO TABS: ORAL_TABLET | ORAL | 0 refills | Status: DC

## 2016-08-13 MED ORDER — AMPHETAMINE-DEXTROAMPHET ER 30 MG PO CP24: 30.0000 mg | ORAL_CAPSULE | Freq: Every day | ORAL | 0 refills | Status: DC

## 2016-08-13 MED ORDER — AMPHETAMINE-DEXTROAMPHETAMINE 10 MG PO TABS
ORAL_TABLET | ORAL | 0 refills | Status: DC
Start: 2016-08-13 — End: 2016-08-13

## 2016-08-13 NOTE — Progress Notes (Signed)
Pre visit review using our clinic review tool, if applicable. No additional management support is needed unless otherwise documented below in the visit note. 

## 2016-08-13 NOTE — Progress Notes (Signed)
OFFICE VISIT  08/13/2016   CC:  Chief Complaint  Patient presents with  . Follow-up    RCI and hemrroids   HPI:    Patient is a 36 y.o. Caucasian female who presents for f/u adult ADD and 2 day f/u painful external hemorrhoid.  Focus, concentration, motivation all responding well to current adderall regimen.  No adverse side effects.  Painful external hemorrhoid: started sitz baths and 2.5% hydrocortisone topical 2 days ago. Pain is completely gone--pt feels normal.     Past Medical History:  Diagnosis Date  . 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  Marland Kitchen ADHD (attention deficit hyperactivity disorder)    dx'd in middle school, has always been on adderall  . Fibroid     Past Surgical History:  Procedure Laterality Date  . CESAREAN SECTION N/A 09/23/2013   Procedure: CESAREAN SECTION;  Surgeon: Cyril Mourning, MD;  Location: [facility];  Service: Obstetrics;  Laterality: N/A;  . DILATION AND CURETTAGE OF UTERUS  2002  . incision and drainage of nasal abscess  09/24/15   Dr. Wilburn Cornelia (MRSA)  . LIPOSUCTION  2003    Outpatient Medications Prior to Visit  Medication Sig Dispense Refill  . clindamycin (CLINDAGEL) 1 % gel Apply 1 application topically every morning.  11  . hydrocortisone (ANUSOL-HC) 2.5 % rectal cream Place 1 application rectally 2 (two) times daily. 30 g 1  . levonorgestrel (MIRENA) 20 MCG/24HR IUD 1 each by Intrauterine route once.    . mupirocin ointment (BACTROBAN) 2 % Apply 1 application topically 3 (three) times daily. 22 g 2  . tretinoin (RETIN-A) 0.05 % cream Apply 1 application topically at bedtime.  11  . amphetamine-dextroamphetamine (ADDERALL XR) 30 MG 24 hr capsule Take 1 capsule (30 mg total) by mouth daily. 30 capsule 0  . amphetamine-dextroamphetamine (ADDERALL) 10 MG tablet 1 tab po q 2 pm 30 tablet 0   No facility-administered medications prior to visit.     Allergies  Allergen  Reactions  . Penicillins Rash   ROS As per HPI  PE: Blood pressure 119/87, pulse 68, temperature 98.2 F (36.8 C), temperature source Oral, resp. rate 16, height 4' 11.25" (1.505 m), weight 130 lb 8 oz (59.2 kg), SpO2 100 %. Wt Readings from Last 2 Encounters:  08/13/16 130 lb 8 oz (59.2 kg)  08/11/16 131 lb 4 oz (59.5 kg)    Gen: alert, oriented x 4, affect pleasant.  Lucid thinking and conversation noted. HEENT: PERRLA, EOMI.   Neck: no LAD, mass, or thyromegaly. CV: RRR, no m/r/g LUNGS: CTA bilat, nonlabored. NEURO: no tremor or tics noted on observation.  Coordination intact. CN 2-12 grossly intact bilaterally, strength 5/5 in all extremeties.  No ataxia.  LABS:  none  IMPRESSION AND PLAN:  1) Adult ADD: The current medical regimen is effective;  continue present plan and medications. I printed rx's for adderall XR 30 mg qd, #30 and adderall IR 10 mg q afternoon, #30 today for this month, May 2018, and June 2018.  Appropriate fill on/after date was noted on each rx.  2) External hemorrhoid, painful--all pain resolved with sitz baths and 2.5% hydrocortisone cream bid the last couple of days.  She will continue senna S daily and add miralax or dulcolax suppository prn.  An After Visit Summary was printed and given to the patient.  FOLLOW UP: Return in about 4 months (around 12/13/2016) for routine chronic  illness f/u.  Signed:  Crissie Sickles, MD           08/13/2016

## 2016-11-05 ENCOUNTER — Encounter: Payer: Self-pay | Admitting: Family Medicine

## 2016-11-05 ENCOUNTER — Ambulatory Visit (INDEPENDENT_AMBULATORY_CARE_PROVIDER_SITE_OTHER): Payer: BLUE CROSS/BLUE SHIELD | Admitting: Family Medicine

## 2016-11-05 VITALS — BP 103/68 | HR 78 | Temp 98.2°F | Resp 16 | Ht 59.25 in | Wt 130.2 lb

## 2016-11-05 DIAGNOSIS — F909 Attention-deficit hyperactivity disorder, unspecified type: Secondary | ICD-10-CM

## 2016-11-05 MED ORDER — AMPHETAMINE-DEXTROAMPHET ER 30 MG PO CP24: 30.0000 mg | ORAL_CAPSULE | Freq: Every day | ORAL | 0 refills | Status: DC

## 2016-11-05 MED ORDER — AMPHETAMINE-DEXTROAMPHETAMINE 10 MG PO TABS: ORAL_TABLET | ORAL | 0 refills | Status: DC

## 2016-11-05 NOTE — Progress Notes (Signed)
OFFICE VISIT  11/05/2016   CC:  Chief Complaint  Patient presents with  . Follow-up    ADHD   HPI:    Patient is a 36 y.o. Caucasian female who presents for 6 mo f/u adult ADD. Concentration, focus, motivation all still well controlled/helped by current adderall dosing. Takes med every day w/out side effects.  ROS: no HAs, no dizziness, no palpitations, no rash, no tremor.   Past Medical History:  Diagnosis Date  . 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  Marland Kitchen ADHD (attention deficit hyperactivity disorder)    dx'd in middle school, has always been on adderall  . Fibroid     Past Surgical History:  Procedure Laterality Date  . CESAREAN SECTION N/A 09/23/2013   Procedure: CESAREAN SECTION;  Surgeon: Cyril Mourning, MD;  Location: [facility];  Service: Obstetrics;  Laterality: N/A;  . DILATION AND CURETTAGE OF UTERUS  2002  . incision and drainage of nasal abscess  09/24/15   Dr. Wilburn Cornelia (MRSA)  . LIPOSUCTION  2003    Outpatient Medications Prior to Visit  Medication Sig Dispense Refill  . clindamycin (CLINDAGEL) 1 % gel Apply 1 application topically every morning.  11  . tretinoin (RETIN-A) 0.05 % cream Apply 1 application topically at bedtime.  11  . amphetamine-dextroamphetamine (ADDERALL XR) 30 MG 24 hr capsule Take 1 capsule (30 mg total) by mouth daily. 30 capsule 0  . amphetamine-dextroamphetamine (ADDERALL) 10 MG tablet 1 tab po q 2 pm 30 tablet 0  . hydrocortisone (ANUSOL-HC) 2.5 % rectal cream Place 1 application rectally 2 (two) times daily. (Patient not taking: Reported on 11/05/2016) 30 g 1  . levonorgestrel (MIRENA) 20 MCG/24HR IUD 1 each by Intrauterine route once.    . mupirocin ointment (BACTROBAN) 2 % Apply 1 application topically 3 (three) times daily. (Patient not taking: Reported on 11/05/2016) 22 g 2   No facility-administered medications prior to visit.     Allergies  Allergen Reactions  .  Penicillins Rash    ROS As per HPI  PE: Blood pressure 103/68, pulse 78, temperature 98.2 F (36.8 C), temperature source Oral, resp. rate 16, height 4' 11.25" (1.505 m), weight 130 lb 4 oz (59.1 kg), last menstrual period 10/08/2016, SpO2 99 %. Body mass index is 26.09 kg/m.  Wt Readings from Last 2 Encounters:  11/05/16 130 lb 4 oz (59.1 kg)  08/13/16 130 lb 8 oz (59.2 kg)    Gen: alert, oriented x 4, affect pleasant.  Lucid thinking and conversation noted. HEENT: PERRLA, EOMI.   Neck: no LAD, mass, or thyromegaly. CV: RRR, no m/r/g LUNGS: CTA bilat, nonlabored. NEURO: no tremor or tics noted on observation.  Coordination intact. CN 2-12 grossly intact bilaterally, strength 5/5 in all extremeties.  No ataxia.   LABS:    Chemistry   No results found for: NA, K, CL, CO2, BUN, CREATININE, GLU No results found for: CALCIUM, ALKPHOS, AST, ALT, BILITOT    IMPRESSION AND PLAN:  Adult ADD: The current medical regimen is effective;  continue present plan and medications. I printed rx's for adderall XR 30mg  qd, #30 and adderall IR/short acting 10mg  q 2 PM, #30 today for July, Aug, and Sept 2018.  Appropriate fill on/after date was noted on each rx.  An After Visit Summary was printed and given to the patient.  FOLLOW UP: Return in about 3 months (around 02/05/2017) for f/u adult ADD.  Signed:  Crissie Sickles, MD           11/05/2016

## 2016-11-15 ENCOUNTER — Ambulatory Visit (INDEPENDENT_AMBULATORY_CARE_PROVIDER_SITE_OTHER): Payer: BLUE CROSS/BLUE SHIELD | Admitting: Family Medicine

## 2016-11-15 ENCOUNTER — Encounter: Payer: Self-pay | Admitting: Family Medicine

## 2016-11-15 VITALS — BP 104/65 | HR 66 | Temp 98.6°F | Resp 16 | Ht 59.25 in | Wt 132.5 lb

## 2016-11-15 DIAGNOSIS — F909 Attention-deficit hyperactivity disorder, unspecified type: Secondary | ICD-10-CM

## 2016-11-15 MED ORDER — AMPHETAMINE-DEXTROAMPHET ER 10 MG PO CP24: ORAL_CAPSULE | ORAL | 0 refills | Status: DC

## 2016-11-15 NOTE — Patient Instructions (Signed)
Take 2 adderall xR 10mg  caps each morning x 7d, then take 1 adderall xr 10mg  cap each morning for 7 days, then stop this medication.  Take 1/2 of 10mg  adderall (short acting) for 7d, then stop this med.

## 2016-11-15 NOTE — Progress Notes (Signed)
OFFICE VISIT  11/15/2016   CC:  Chief Complaint  Patient presents with  . Routine Prenatal Visit    wants to discuss weaning off Adderall   HPI:    Patient is a 36 y.o. Caucasian female who presents for discussion of the need to ween off her adderall due to now being pregnant.  She found out yesterday that she is pregnant--home pregnancy test. She weened off adderall in her last pregnancy and did well other than some fatigue. She has been on this med long term.  Past Medical History:  Diagnosis Date  . 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  Marland Kitchen ADHD (attention deficit hyperactivity disorder)    dx'd in middle school, has always been on adderall  . Fibroid    Social History   Social History  . Marital status: Married    Spouse name: N/A  . Number of children: N/A  . Years of education: N/A   Social History Main Topics  . Smoking status: Never Smoker  . Smokeless tobacco: Never Used  . Alcohol use Yes  . Drug use: No  . Sexual activity: Yes    Birth control/ protection: None   Other Topics Concern  . None   Social History Narrative   Married, has one son.   [employer] Bachelors degree.  Orig from [place] area.   Relocated from [place] area spring 2015.   Occupation: Art therapist at [employer].   No tob, rare alc, no drugs.          Past Surgical History:  Procedure Laterality Date  . CESAREAN SECTION N/A 09/23/2013   Procedure: CESAREAN SECTION;  Surgeon: Cyril Mourning, MD;  Location: [facility];  Service: Obstetrics;  Laterality: N/A;  . DILATION AND CURETTAGE OF UTERUS  2002  . incision and drainage of nasal abscess  09/24/15   Dr. Wilburn Cornelia (MRSA)  . LIPOSUCTION  2003    Outpatient Medications Prior to Visit  Medication Sig Dispense Refill  . amphetamine-dextroamphetamine (ADDERALL) 10 MG tablet 1 tab po q 2 pm 30 tablet 0  . SENNA PO Take 2 capsules by mouth daily.    Marland Kitchen  amphetamine-dextroamphetamine (ADDERALL XR) 30 MG 24 hr capsule Take 1 capsule (30 mg total) by mouth daily. 30 capsule 0  . clindamycin (CLINDAGEL) 1 % gel Apply 1 application topically every morning.  11  . tretinoin (RETIN-A) 0.05 % cream Apply 1 application topically at bedtime.  11   No facility-administered medications prior to visit.     Allergies  Allergen Reactions  . Penicillins Rash    ROS As per HPI  PE: Blood pressure 104/65, pulse 66, temperature 98.6 F (37 C), temperature source Oral, resp. rate 16, height 4' 11.25" (1.505 m), weight 132 lb 8 oz (60.1 kg), last menstrual period 10/08/2016, SpO2 100 %. Gen: Alert, well appearing.  Patient is oriented to person, place, time, and situation. AFFECT: pleasant, lucid thought and speech. No further exam today.  LABS:  none  IMPRESSION AND PLAN:  Adult ADD, now with early pregnancy.  Ween off adderall discussed. Instructions: Take 2 adderall xR 10mg  caps each morning x 7d, then take 1 adderall xr 10mg  cap each morning for 7 days, then stop this medication.  Take 1/2 of 10mg  adderall (short acting) for 7d, then stop this med.  An After Visit Summary was printed and given to the patient.  FOLLOW UP: Return for  f/u as needed..  Signed:  Crissie Sickles, MD           11/15/2016

## 2017-02-25 ENCOUNTER — Ambulatory Visit (INDEPENDENT_AMBULATORY_CARE_PROVIDER_SITE_OTHER): Payer: BLUE CROSS/BLUE SHIELD | Admitting: Family Medicine

## 2017-02-25 ENCOUNTER — Encounter: Payer: Self-pay | Admitting: Family Medicine

## 2017-02-25 VITALS — BP 102/67 | HR 65 | Temp 98.0°F | Resp 16 | Ht 59.25 in | Wt 135.5 lb

## 2017-02-25 DIAGNOSIS — Z23 Encounter for immunization: Secondary | ICD-10-CM | POA: Diagnosis not present

## 2017-02-25 DIAGNOSIS — F909 Attention-deficit hyperactivity disorder, unspecified type: Secondary | ICD-10-CM | POA: Diagnosis not present

## 2017-02-25 MED ORDER — AMPHETAMINE-DEXTROAMPHET ER 20 MG PO CP24: 20.0000 mg | ORAL_CAPSULE | ORAL | 0 refills | Status: DC

## 2017-02-25 MED ORDER — AMPHETAMINE-DEXTROAMPHETAMINE 10 MG PO TABS: ORAL_TABLET | ORAL | 0 refills | Status: DC

## 2017-02-25 NOTE — Progress Notes (Signed)
OFFICE VISIT  02/25/2017   CC:  Chief Complaint  Patient presents with  . Follow-up    ADHD   HPI:    Patient is a 36 y.o. Caucasian female who presents for 3 mo f/u ADHD. Last f/u we discussed weening off her adderall b/c she was in 1st trimester of pregnancy. Unfortunately, she had a miscarriage and is here today to restart her meds.  Felt poor concentration, focus, motivation, and organization skills when off the med.  When she resumed the meds after miscarriage, she felt much improved, ran out of med yesterday.  She is still going to continue to try to get pregnant.  She did find it difficult to come off the adderall, so she is interested in a lower XR dose at restart today so if she had to ween again it may be easier.  She feels like she is coping appropriately about the miscarriage at this time, denies depression.   Past Medical History:  Diagnosis Date  . 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  Marland Kitchen ADHD (attention deficit hyperactivity disorder)    dx'd in middle school, has always been on adderall  . Fibroid     Past Surgical History:  Procedure Laterality Date  . CESAREAN SECTION N/A 09/23/2013   Procedure: CESAREAN SECTION;  Surgeon: Cyril Mourning, MD;  Location: [facility];  Service: Obstetrics;  Laterality: N/A;  . DILATION AND CURETTAGE OF UTERUS  2002  . incision and drainage of nasal abscess  09/24/15   Dr. Wilburn Cornelia (MRSA)  . LIPOSUCTION  2003    Outpatient Medications Prior to Visit  Medication Sig Dispense Refill  . SENNA PO Take 2 capsules by mouth daily.    Marland Kitchen amphetamine-dextroamphetamine (ADDERALL) 10 MG tablet 1 tab po q 2 pm 30 tablet 0  . amphetamine-dextroamphetamine (ADDERALL XR) 10 MG 24 hr capsule 2 caps po qAM x 7d, then 1 cap po qAM x 7d, then stop. (Patient not taking: Reported on 02/25/2017) 21 capsule 0   No facility-administered medications prior to visit.     Allergies   Allergen Reactions  . Penicillins Rash    ROS As per HPI  PE: Blood pressure 102/67, pulse 65, temperature 98 F (36.7 C), temperature source Oral, resp. rate 16, height 4' 11.25" (1.505 m), weight 135 lb 8 oz (61.5 kg), last menstrual period 02/20/2017, SpO2 100 %. Wt Readings from Last 2 Encounters:  02/25/17 135 lb 8 oz (61.5 kg)  11/15/16 132 lb 8 oz (60.1 kg)    Gen: alert, oriented x 4, affect pleasant.  Lucid thinking and conversation noted. HEENT: PERRLA, EOMI.   Neck: no LAD, mass, or thyromegaly. CV: RRR, no m/r/g LUNGS: CTA bilat, nonlabored. NEURO: no tremor or tics noted on observation.  Coordination intact. CN 2-12 grossly intact bilaterally, strength 5/5 in all extremeties.  No ataxia.   LABS:    Chemistry   No results found for: NA, K, CL, CO2, BUN, CREATININE, GLU No results found for: CALCIUM, ALKPHOS, AST, ALT, BILITOT     IMPRESSION AND PLAN:  1) Adult ADD: will drop her XR dose down to 20mg  qAM as stated in HPI. Continue adderall short-acting 10mg  q2 PM. I printed rx's for adderall XR 20mg  qAM, #30 and adderall IR 10mg  q 2 PM, #30 today for this month, Nov, and Dec 2018.  Appropriate fill on/after date was noted on each rx.  2) Adjustment d/o  with anx/dep features: She is grieving appropriately and does not feel like any meds or counseling are needed.  An After Visit Summary was printed and given to the patient.  FOLLOW UP: Return in about 3 months (around 05/28/2017) for f/u ADD.  Signed:  Crissie Sickles, MD           02/25/2017

## 2017-05-19 ENCOUNTER — Encounter: Payer: Self-pay | Admitting: *Deleted

## 2017-05-20 ENCOUNTER — Ambulatory Visit: Payer: BLUE CROSS/BLUE SHIELD | Admitting: Family Medicine

## 2017-05-20 ENCOUNTER — Encounter: Payer: Self-pay | Admitting: Family Medicine

## 2017-05-20 VITALS — BP 111/74 | HR 79 | Resp 16 | Wt 139.0 lb

## 2017-05-20 DIAGNOSIS — F988 Other specified behavioral and emotional disorders with onset usually occurring in childhood and adolescence: Secondary | ICD-10-CM

## 2017-05-20 MED ORDER — AMPHETAMINE-DEXTROAMPHETAMINE 10 MG PO TABS: ORAL_TABLET | ORAL | 0 refills | Status: DC

## 2017-05-20 MED ORDER — AMPHETAMINE-DEXTROAMPHET ER 20 MG PO CP24: 20.0000 mg | ORAL_CAPSULE | ORAL | 0 refills | Status: DC

## 2017-05-20 NOTE — Progress Notes (Signed)
OFFICE VISIT  05/20/2017   CC:  Chief Complaint  Patient presents with  . ADHD    follow up   HPI:    Patient is a 37 y.o. Caucasian female who presents accompanied by her cute 19 y/o son [place] for 3 mo f/u adult ADD. Last visit we restarted adderall after she had been off it for pregnancy.  Unfortunately she had a miscarriage. She elected to restart the med at lower dosing: adderall xr 20mg  qAM and adderall short acting 10mg  q afternoon. Pt states all is going well with the med at current dosing: much improved focus, concentration, task completion.  Less frustration, better multitasking, less impulsivity and restlessness.  Mood is stable. No side effects from the medication.  Still trying to get pregnant.  LMP 05/16/17.  Past Medical History:  Diagnosis Date  . 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  Marland Kitchen ADHD (attention deficit hyperactivity disorder)    dx'd in middle school, has always been on adderall  . Fibroid   . Miscarriage 2018    Past Surgical History:  Procedure Laterality Date  . CESAREAN SECTION N/A 09/23/2013   Procedure: CESAREAN SECTION;  Surgeon: Cyril Mourning, MD;  Location: [facility];  Service: Obstetrics;  Laterality: N/A;  . DILATION AND CURETTAGE OF UTERUS  2002  . incision and drainage of nasal abscess  09/24/15   Dr. Wilburn Cornelia (MRSA)  . LIPOSUCTION  2003    Outpatient Medications Prior to Visit  Medication Sig Dispense Refill  . SENNA PO Take 2 capsules by mouth daily.    Marland Kitchen amphetamine-dextroamphetamine (ADDERALL XR) 20 MG 24 hr capsule Take 1 capsule (20 mg total) by mouth every morning. 30 capsule 0  . amphetamine-dextroamphetamine (ADDERALL) 10 MG tablet 1 tab po q 2 pm 30 tablet 0   No facility-administered medications prior to visit.     Allergies  Allergen Reactions  . Penicillins Rash    ROS As per HPI  PE: Blood pressure 111/74, pulse 79, resp. rate 16, weight 139 lb (63  kg), last menstrual period 05/16/2017, SpO2 98 %, unknown if currently breastfeeding. Wt Readings from Last 2 Encounters:  05/20/17 139 lb (63 kg)  02/25/17 135 lb 8 oz (61.5 kg)    Gen: alert, oriented x 4, affect pleasant.  Lucid thinking and conversation noted. HEENT: PERRLA, EOMI.   Neck: no LAD, mass, or thyromegaly. CV: RRR, no m/r/g LUNGS: CTA bilat, nonlabored. NEURO: no tremor or tics noted on observation.  Coordination intact. CN 2-12 grossly intact bilaterally, strength 5/5 in all extremeties.  No ataxia.   LABS:  none  IMPRESSION AND PLAN:  Adult ADD: The current medical regimen is effective;  continue present plan and medications. I printed rx's for adderall xr 20mg , 1 qAM, #30 and adderall 10mg  short acting, 1 q afternoon, #30 today for this month, feb 2019, and march 2019.  Appropriate fill on/after date was noted on each rx.  An After Visit Summary was printed and given to the patient.  FOLLOW UP: Return in about 3 months (around 08/18/2017) for f/u ADD.  Signed:  Crissie Sickles, MD           05/20/2017

## 2017-05-20 NOTE — Progress Notes (Deleted)
OFFICE VISIT  05/20/2017   CC: No chief complaint on file.    HPI:    Patient is a 37 y.o. Caucasian female who presents for 3 mo f/u adult ADD. Last visit we restarted her on adderall after she had been off of it for pregnany (miscarriage). Started adderall XR 20mg  qAM and 10mg  short acting adderall q afternoon last visit.  Past Medical History:  Diagnosis Date  . 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  Marland Kitchen ADHD (attention deficit hyperactivity disorder)    dx'd in middle school, has always been on adderall  . Fibroid     Past Surgical History:  Procedure Laterality Date  . CESAREAN SECTION N/A 09/23/2013   Procedure: CESAREAN SECTION;  Surgeon: Cyril Mourning, MD;  Location: [facility];  Service: Obstetrics;  Laterality: N/A;  . DILATION AND CURETTAGE OF UTERUS  2002  . incision and drainage of nasal abscess  09/24/15   Dr. Wilburn Cornelia (MRSA)  . LIPOSUCTION  2003    Outpatient Medications Prior to Visit  Medication Sig Dispense Refill  . amphetamine-dextroamphetamine (ADDERALL XR) 20 MG 24 hr capsule Take 1 capsule (20 mg total) by mouth every morning. 30 capsule 0  . amphetamine-dextroamphetamine (ADDERALL) 10 MG tablet 1 tab po q 2 pm 30 tablet 0  . SENNA PO Take 2 capsules by mouth daily.     No facility-administered medications prior to visit.     Allergies  Allergen Reactions  . Penicillins Rash    ROS As per HPI  PE: Last menstrual period 02/20/2017. ***  LABS:    Chemistry   No results found for: NA, K, CL, CO2, BUN, CREATININE, GLU No results found for: CALCIUM, ALKPHOS, AST, ALT, BILITOT   No results found for: TSH   IMPRESSION AND PLAN:  No problem-specific Assessment & Plan notes found for this encounter.   FOLLOW UP: No Follow-up on file.

## 2017-06-13 ENCOUNTER — Ambulatory Visit: Payer: BLUE CROSS/BLUE SHIELD | Admitting: Family Medicine

## 2017-06-13 ENCOUNTER — Encounter: Payer: Self-pay | Admitting: Family Medicine

## 2017-06-13 VITALS — BP 110/70 | HR 66 | Temp 98.3°F | Wt 138.3 lb

## 2017-06-13 DIAGNOSIS — B9789 Other viral agents as the cause of diseases classified elsewhere: Secondary | ICD-10-CM | POA: Diagnosis not present

## 2017-06-13 DIAGNOSIS — J069 Acute upper respiratory infection, unspecified: Secondary | ICD-10-CM | POA: Diagnosis not present

## 2017-06-13 MED ORDER — BENZONATATE 100 MG PO CAPS: 100.0000 mg | ORAL_CAPSULE | Freq: Three times a day (TID) | ORAL | 0 refills | Status: DC | PRN

## 2017-06-13 NOTE — Patient Instructions (Signed)
Upper Respiratory Infection, Adult Most upper respiratory infections (URIs) are a viral infection of the air passages leading to the lungs. A URI affects the nose, throat, and upper air passages. The most common type of URI is nasopharyngitis and is typically referred to as "the common cold." URIs run their course and usually go away on their own. Most of the time, a URI does not require medical attention, but sometimes a bacterial infection in the upper airways can follow a viral infection. This is called a secondary infection. Sinus and middle ear infections are common types of secondary upper respiratory infections. Bacterial pneumonia can also complicate a URI. A URI can worsen asthma and chronic obstructive pulmonary disease (COPD). Sometimes, these complications can require emergency medical care and may be life threatening. What are the causes? Almost all URIs are caused by viruses. A virus is a type of germ and can spread from one person to another. What increases the risk? You may be at risk for a URI if:  You smoke.  You have chronic heart or lung disease.  You have a weakened defense (immune) system.  You are very young or very old.  You have nasal allergies or asthma.  You work in crowded or poorly ventilated areas.  You work in health care facilities or schools.  What are the signs or symptoms? Symptoms typically develop 2-3 days after you come in contact with a cold virus. Most viral URIs last 7-10 days. However, viral URIs from the influenza virus (flu virus) can last 14-18 days and are typically more severe. Symptoms may include:  Runny or stuffy (congested) nose.  Sneezing.  Cough.  Sore throat.  Headache.  Fatigue.  Fever.  Loss of appetite.  Pain in your forehead, behind your eyes, and over your cheekbones (sinus pain).  Muscle aches.  How is this diagnosed? Your health care provider may diagnose a URI by:  Physical exam.  Tests to check that your  symptoms are not due to another condition such as: ? Strep throat. ? Sinusitis. ? Pneumonia. ? Asthma.  How is this treated? A URI goes away on its own with time. It cannot be cured with medicines, but medicines may be prescribed or recommended to relieve symptoms. Medicines may help:  Reduce your fever.  Reduce your cough.  Relieve nasal congestion.  Follow these instructions at home:  Take medicines only as directed by your health care provider.  Gargle warm saltwater or take cough drops to comfort your throat as directed by your health care provider.  Use a warm mist humidifier or inhale steam from a shower to increase air moisture. This may make it easier to breathe.  Drink enough fluid to keep your urine clear or pale yellow.  Eat soups and other clear broths and maintain good nutrition.  Rest as needed.  Return to work when your temperature has returned to normal or as your health care provider advises. You may need to stay home longer to avoid infecting others. You can also use a face mask and careful hand washing to prevent spread of the virus.  Increase the usage of your inhaler if you have asthma.  Do not use any tobacco products, including cigarettes, chewing tobacco, or electronic cigarettes. If you need help quitting, ask your health care provider. How is this prevented? The best way to protect yourself from getting a cold is to practice good hygiene.  Avoid oral or hand contact with people with cold symptoms.  Wash your   hands often if contact occurs.  There is no clear evidence that vitamin C, vitamin E, echinacea, or exercise reduces the chance of developing a cold. However, it is always recommended to get plenty of rest, exercise, and practice good nutrition. Contact a health care provider if:  You are getting worse rather than better.  Your symptoms are not controlled by medicine.  You have chills.  You have worsening shortness of breath.  You have  brown or red mucus.  You have yellow or brown nasal discharge.  You have pain in your face, especially when you bend forward.  You have a fever.  You have swollen neck glands.  You have pain while swallowing.  You have white areas in the back of your throat. Get help right away if:  You have severe or persistent: ? Headache. ? Ear pain. ? Sinus pain. ? Chest pain.  You have chronic lung disease and any of the following: ? Wheezing. ? Prolonged cough. ? Coughing up blood. ? A change in your usual mucus.  You have a stiff neck.  You have changes in your: ? Vision. ? Hearing. ? Thinking. ? Mood. This information is not intended to replace advice given to you by your health care provider. Make sure you discuss any questions you have with your health care provider. Document Released: 10/20/2000 Document Revised: 12/28/2015 Document Reviewed: 08/01/2013 Elsevier Interactive Patient Education  2018 Elsevier Inc.  

## 2017-06-13 NOTE — Progress Notes (Signed)
Subjective:     Patient ID: Sharon Richards, female   DOB: [birth year], 37 y.o.   MRN: [id number]  HPI Patient is nonsmoker seen with onset last Wednesday of some nasal drainage, bilateral ear pressure, sore throat, and dry cough. She is concerned because she has upcoming travel with her work with flying this Thursday. She denies any fever or chills. No nausea, vomiting, or diarrhea. Cough has been particularly bothersome at night  Past Medical History:  Diagnosis Date  . 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  Marland Kitchen ADHD (attention deficit hyperactivity disorder)    dx'd in middle school, has always been on adderall  . Fibroid   . Miscarriage 2018   Past Surgical History:  Procedure Laterality Date  . CESAREAN SECTION N/A 09/23/2013   Procedure: CESAREAN SECTION;  Surgeon: Cyril Mourning, MD;  Location: [facility];  Service: Obstetrics;  Laterality: N/A;  . DILATION AND CURETTAGE OF UTERUS  2002  . incision and drainage of nasal abscess  09/24/15   Dr. Wilburn Cornelia (MRSA)  . LIPOSUCTION  2003    reports that  has never smoked. she has never used smokeless tobacco. She reports that she drinks alcohol. She reports that she does not use drugs. family history includes Asthma in her sister; Heart disease in her maternal grandmother. Allergies  Allergen Reactions  . Penicillins Rash     Review of Systems  Constitutional: Negative for chills and fever.  HENT: Positive for congestion, ear pain, sinus pressure and sore throat.   Respiratory: Positive for cough.        Objective:   Physical Exam  Constitutional: She appears well-developed and well-nourished.  HENT:  Right Ear: External ear normal.  Left Ear: External ear normal.  Mouth/Throat: Oropharynx is clear and moist. No oropharyngeal exudate.  Neck: Neck supple.  Cardiovascular: Normal rate and regular rhythm.  Pulmonary/Chest: Effort normal and breath sounds normal. No  respiratory distress. She has no wheezes. She has no rales.  Lymphadenopathy:    She has no cervical adenopathy.       Assessment:     Viral URI with cough. No respiratory distress    Plan:     -Tessalon Perles 100 mg every 8 hours as needed for cough -Consider Afrin nasal spray about one hour prior to flight on Thursday if still has nasal congestion then  Eulas Post MD [facility]

## 2017-07-01 ENCOUNTER — Ambulatory Visit: Payer: BLUE CROSS/BLUE SHIELD | Admitting: Family Medicine

## 2017-07-01 ENCOUNTER — Encounter: Payer: Self-pay | Admitting: Family Medicine

## 2017-07-01 ENCOUNTER — Encounter: Payer: Self-pay | Admitting: *Deleted

## 2017-07-01 VITALS — BP 104/70 | HR 86 | Temp 98.4°F | Resp 16 | Ht 59.5 in | Wt 134.5 lb

## 2017-07-01 DIAGNOSIS — Z13220 Encounter for screening for lipoid disorders: Secondary | ICD-10-CM

## 2017-07-01 DIAGNOSIS — Z131 Encounter for screening for diabetes mellitus: Secondary | ICD-10-CM

## 2017-07-01 DIAGNOSIS — Z130 Encounter for screening for diseases of the blood and blood-forming organs and certain disorders involving the immune mechanism: Secondary | ICD-10-CM | POA: Diagnosis not present

## 2017-07-01 DIAGNOSIS — R5382 Chronic fatigue, unspecified: Secondary | ICD-10-CM

## 2017-07-01 DIAGNOSIS — Z0000 Encounter for general adult medical examination without abnormal findings: Secondary | ICD-10-CM | POA: Diagnosis not present

## 2017-07-01 DIAGNOSIS — Z8759 Personal history of other complications of pregnancy, childbirth and the puerperium: Secondary | ICD-10-CM | POA: Diagnosis not present

## 2017-07-01 DIAGNOSIS — N921 Excessive and frequent menstruation with irregular cycle: Secondary | ICD-10-CM

## 2017-07-01 LAB — COMPREHENSIVE METABOLIC PANEL
ALK PHOS: 45 U/L (ref 39–117)
ALT: 12 U/L (ref 0–35)
AST: 17 U/L (ref 0–37)
Albumin: 4.6 g/dL (ref 3.5–5.2)
BILIRUBIN TOTAL: 1.2 mg/dL (ref 0.2–1.2)
BUN: 15 mg/dL (ref 6–23)
CO2: 25 mEq/L (ref 19–32)
CREATININE: 0.76 mg/dL (ref 0.40–1.20)
Calcium: 9.5 mg/dL (ref 8.4–10.5)
Chloride: 104 mEq/L (ref 96–112)
GFR: 91.02 mL/min (ref >60.00)
GLUCOSE: 86 mg/dL (ref 70–99)
Potassium: 4.6 mEq/L (ref 3.5–5.1)
SODIUM: 136 meq/L (ref 135–145)
TOTAL PROTEIN: 6.8 g/dL (ref 6.0–8.3)

## 2017-07-01 LAB — CBC WITH DIFFERENTIAL/PLATELET
BASOS PCT: 0.2 % (ref 0.0–3.0)
Basophils Absolute: 0 10*3/uL (ref 0.0–0.1)
EOS PCT: 0.8 % (ref 0.0–5.0)
Eosinophils Absolute: 0.1 10*3/uL (ref 0.0–0.7)
HCT: 39.8 % (ref 36.0–46.0)
Hemoglobin: 13.1 g/dL (ref 12.0–15.0)
LYMPHS ABS: 1.2 10*3/uL (ref 0.7–4.0)
Lymphocytes Relative: 14.7 % (ref 12.0–46.0)
MCHC: 33 g/dL (ref 30.0–36.0)
MCV: 83.2 fl (ref 78.0–100.0)
MONO ABS: 0.4 10*3/uL (ref 0.1–1.0)
Monocytes Relative: 4.7 % (ref 3.0–12.0)
NEUTROS PCT: 79.6 % — AB (ref 43.0–77.0)
Neutro Abs: 6.6 10*3/uL (ref 1.4–7.7)
PLATELETS: 292 10*3/uL (ref 150.0–400.0)
RBC: 4.79 Mil/uL (ref 3.87–5.11)
RDW: 13.1 % (ref 11.5–15.5)
WBC: 8.3 10*3/uL (ref 4.0–10.5)

## 2017-07-01 LAB — LIPID PANEL
Cholesterol: 171 mg/dL (ref 0–200)
HDL: 81.8 mg/dL (ref >39.00)
LDL Cholesterol: 72 mg/dL (ref 0–99)
NONHDL: 88.7
Total CHOL/HDL Ratio: 2
Triglycerides: 86 mg/dL (ref 0.0–149.0)
VLDL: 17.2 mg/dL (ref 0.0–40.0)

## 2017-07-01 LAB — IRON: Iron: 109 ug/dL (ref 42–145)

## 2017-07-01 LAB — TSH: TSH: 1.21 u[IU]/mL (ref 0.35–4.50)

## 2017-07-01 LAB — FERRITIN: Ferritin: 63.1 ng/mL (ref 10.0–291.0)

## 2017-07-01 MED ORDER — AMPHETAMINE-DEXTROAMPHET ER 20 MG PO CP24: 20.0000 mg | ORAL_CAPSULE | ORAL | 0 refills | Status: DC

## 2017-07-01 MED ORDER — AMPHETAMINE-DEXTROAMPHETAMINE 10 MG PO TABS: ORAL_TABLET | ORAL | 0 refills | Status: DC

## 2017-07-01 MED ORDER — AMPHETAMINE-DEXTROAMPHET ER 20 MG PO CP24: 20.0000 mg | ORAL_CAPSULE | ORAL | 0 refills | Status: AC

## 2017-07-01 MED ORDER — AMPHETAMINE-DEXTROAMPHETAMINE 10 MG PO TABS: ORAL_TABLET | ORAL | 0 refills | Status: AC

## 2017-07-01 NOTE — Progress Notes (Signed)
Office Note 07/01/2017  CC:  Chief Complaint  Patient presents with  . Annual Exam    Pt is fasting.     HPI:  Sharon Richards is a 37 y.o. White female who is here for annual health maintenance exam. GYN MD: Dr. Marylynn Pearson.  Exercise: regularly. Diet: healthy  Fatigued, menstrual periods heavy ---last 3 days.  Variable interval between menses.   "Haven't felt right ever since miscarriage in 7/ 2018".  Past Medical History:  Diagnosis Date  . 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  Marland Kitchen ADHD (attention deficit hyperactivity disorder)    dx'd in middle school, has always been on adderall  . Fibroid   . Miscarriage 2018    Past Surgical History:  Procedure Laterality Date  . CESAREAN SECTION N/A 09/23/2013   Procedure: CESAREAN SECTION;  Surgeon: Cyril Mourning, MD;  Location: [facility];  Service: Obstetrics;  Laterality: N/A;  . DILATION AND CURETTAGE OF UTERUS  2002  . incision and drainage of nasal abscess  09/24/15   Dr. Wilburn Cornelia (MRSA)  . LIPOSUCTION  2003    Family History  Problem Relation Age of Onset  . Heart disease Maternal Grandmother   . Asthma Sister   . Colon cancer Neg Hx   . Breast cancer Neg Hx     Social History   Socioeconomic History  . Marital status: Married    Spouse name: Not on file  . Number of children: Not on file  . Years of education: Not on file  . Highest education level: Not on file  Social Needs  . Financial resource strain: Not on file  . Food insecurity - worry: Not on file  . Food insecurity - inability: Not on file  . Transportation needs - medical: Not on file  . Transportation needs - non-medical: Not on file  Occupational History  . Not on file  Tobacco Use  . Smoking status: Never Smoker  . Smokeless tobacco: Never Used  Substance and Sexual Activity  . Alcohol use: Yes  . Drug use: No  . Sexual activity: Yes    Birth control/protection: None   Other Topics Concern  . Not on file  Social History Narrative   Married, has one son.   [employer] Bachelors degree.  Orig from [place] area.   Relocated from [place] area spring 2015.   Occupation: Art therapist at [employer].   No tob, rare alc, no drugs.       Outpatient Medications Prior to Visit  Medication Sig Dispense Refill  . SENNA PO Take 2 capsules by mouth daily.    Marland Kitchen amphetamine-dextroamphetamine (ADDERALL XR) 20 MG 24 hr capsule Take 1 capsule (20 mg total) by mouth every morning. 30 capsule 0  . amphetamine-dextroamphetamine (ADDERALL) 10 MG tablet 1 tab po q 2 pm 30 tablet 0  . benzonatate (TESSALON) 100 MG capsule Take 1 capsule (100 mg total) by mouth 3 (three) times daily as needed for cough. (Patient not taking: Reported on 07/01/2017) 30 capsule 0   No facility-administered medications prior to visit.     Allergies  Allergen Reactions  . Penicillins Rash    ROS Review of Systems  Constitutional: Positive for fatigue. Negative for appetite change, chills and fever.  HENT: Negative for congestion, dental problem, ear pain and sore throat.   Eyes: Negative for discharge, redness and visual disturbance.  Respiratory: Negative for cough, chest tightness,  shortness of breath and wheezing.   Cardiovascular: Negative for chest pain, palpitations and leg swelling.  Gastrointestinal: Negative for abdominal pain, blood in stool, diarrhea, nausea and vomiting.  Genitourinary: Negative for difficulty urinating, dysuria, flank pain, frequency, hematuria and urgency.  Musculoskeletal: Negative for arthralgias, back pain, joint swelling, myalgias and neck stiffness.       "I'm sore a lot of the time".  Skin: Negative for pallor and rash.  Neurological: Negative for dizziness, speech difficulty, weakness and headaches.  Hematological: Negative for adenopathy. Does not bruise/bleed easily.  Psychiatric/Behavioral: Negative for confusion and sleep disturbance. The  patient is not nervous/anxious.    PE; Blood pressure 104/70, pulse 86, temperature 98.4 F (36.9 C), temperature source Oral, resp. rate 16, height 4' 11.5" (1.511 m), weight 134 lb 8 oz (61 kg), last menstrual period 06/12/2017, SpO2 99 %, unknown if currently breastfeeding. Body mass index is 26.71 kg/m. Pt examined with Helayne Seminole, CMA, as chaperone. Gen: Alert, well appearing.  Patient is oriented to person, place, time, and situation. AFFECT: pleasant, lucid thought and speech. ENT: Ears: EACs clear, normal epithelium.  TMs with good light reflex and landmarks bilaterally.  Eyes: no injection, icteris, swelling, or exudate.  EOMI, PERRLA. Nose: no drainage or turbinate edema/swelling.  No injection or focal lesion.  Mouth: lips without lesion/swelling.  Oral mucosa pink and moist.  Dentition intact and without obvious caries or gingival swelling.  Oropharynx without erythema, exudate, or swelling.  Neck: supple/nontender.  No LAD, mass, or TM.  Carotid pulses 2+ bilaterally, without bruits. CV: RRR, no m/r/g.   LUNGS: CTA bilat, nonlabored resps, good aeration in all lung fields. ABD: soft, NT, ND, BS normal.  No hepatospenomegaly or mass.  No bruits. EXT: no clubbing, cyanosis, or edema.  Musculoskeletal: no joint swelling, erythema, warmth, or tenderness.  ROM of all joints intact. Skin - no sores or suspicious lesions or rashes or color changes  Pertinent labs:  No results found for: TSH Lab Results  Component Value Date   WBC 12.4 (H) 09/24/2013   HGB 8.2 (L) 09/24/2013   HCT 25.0 (L) 09/24/2013   MCV 82.5 09/24/2013   PLT 190 09/24/2013   No results found for: CREATININE, BUN, NA, K, CL, CO2 No results found for: ALT, AST, GGT, ALKPHOS, BILITOT No results found for: CHOL No results found for: HDL No results found for: LDLCALC No results found for: TRIG No results found for: CHOLHDL  ASSESSMENT AND PLAN:   1) Chronic fatigue: since miscarriage 11/2016. She did  have excessive bleeding with the miscarriage x 3 weeks and did not take any iron. Having heavy menses since then. Check TSH, CBC, iron studies.  2) Health maintenance exam: Reviewed age and gender appropriate health maintenance issues (prudent diet, regular exercise, health risks of tobacco and excessive alcohol, use of seatbelts, fire alarms in home, use of sunscreen).  Also reviewed age and gender appropriate health screening as well as vaccine recommendations. Vaccines: Tdap and flu vaccine UTD. Labs: HP labs (fasting) today. Cervical ca screening: most recent pap 04/29/15.  Pt has GYN MD to f/u with. Breast ca screening: pt avg risk= start annual mammograms at age 28 yrs.  3) Adult ADD: stable. I printed rx's for adderall XR 20mg , 1 QAM, #30 and adderall 10mg , 1 q afternoon, #30 today for April 2019, May 2019, and June 2019.  Appropriate fill on/after date was noted on each rx. Controlled substance contract reviewed, signed today. UDS at future visit.  An  After Visit Summary was printed and given to the patient.  FOLLOW UP:  Return in about 5 months (around 11/28/2017) for f/u adult add.  Signed:  Crissie Sickles, MD           07/01/2017

## 2017-07-01 NOTE — Patient Instructions (Signed)

## 2017-07-02 LAB — IRON AND TIBC
IRON SATURATION: 28 % (ref 15–55)
IRON: 101 ug/dL (ref 27–159)
Total Iron Binding Capacity: 356 ug/dL (ref 250–450)
UIBC: 255 ug/dL (ref 131–425)

## 2017-07-04 ENCOUNTER — Encounter: Payer: Self-pay | Admitting: *Deleted

## 2017-08-12 ENCOUNTER — Encounter (HOSPITAL_COMMUNITY): Payer: Self-pay

## 2017-08-12 ENCOUNTER — Encounter (HOSPITAL_COMMUNITY)
Admission: AD | Disposition: A | Discharge status: Home / Self Care | Payer: Self-pay | Source: Ambulatory Visit | Attending: Obstetrics and Gynecology

## 2017-08-12 ENCOUNTER — Ambulatory Visit (HOSPITAL_COMMUNITY)
Admission: AD | Admit: 2017-08-12 | Discharge: 2017-08-12 | Disposition: A | Discharge status: Home / Self Care | Payer: BLUE CROSS/BLUE SHIELD | Source: Ambulatory Visit | Attending: Obstetrics and Gynecology | Admitting: Obstetrics and Gynecology

## 2017-08-12 ENCOUNTER — Ambulatory Visit (HOSPITAL_COMMUNITY): Payer: BLUE CROSS/BLUE SHIELD | Admitting: Registered Nurse

## 2017-08-12 ENCOUNTER — Other Ambulatory Visit: Payer: Self-pay

## 2017-08-12 DIAGNOSIS — Z880 Allergy status to penicillin: Secondary | ICD-10-CM | POA: Insufficient documentation

## 2017-08-12 DIAGNOSIS — Z8614 Personal history of Methicillin resistant Staphylococcus aureus infection: Secondary | ICD-10-CM | POA: Diagnosis not present

## 2017-08-12 DIAGNOSIS — O021 Missed abortion: Secondary | ICD-10-CM | POA: Diagnosis present

## 2017-08-12 DIAGNOSIS — F909 Attention-deficit hyperactivity disorder, unspecified type: Secondary | ICD-10-CM | POA: Insufficient documentation

## 2017-08-12 DIAGNOSIS — Z8249 Family history of ischemic heart disease and other diseases of the circulatory system: Secondary | ICD-10-CM | POA: Diagnosis not present

## 2017-08-12 DIAGNOSIS — Z79899 Other long term (current) drug therapy: Secondary | ICD-10-CM | POA: Insufficient documentation

## 2017-08-12 HISTORY — PX: DILATION AND EVACUATION: SHX1459

## 2017-08-12 LAB — CBC
HCT: 37.5 % (ref 36.0–46.0)
HEMOGLOBIN: 12.2 g/dL (ref 12.0–15.0)
MCH: 26.9 pg (ref 26.0–34.0)
MCHC: 32.5 g/dL (ref 30.0–36.0)
MCV: 82.6 fL (ref 78.0–100.0)
Platelets: 294 10*3/uL (ref 150–400)
RBC: 4.54 MIL/uL (ref 3.87–5.11)
RDW: 13.6 % (ref 11.5–15.5)
WBC: 7.2 10*3/uL (ref 4.0–10.5)

## 2017-08-12 SURGERY — DILATION AND EVACUATION, UTERUS
Anesthesia: Monitor Anesthesia Care

## 2017-08-12 MED ORDER — ONDANSETRON HCL 4 MG/2ML IJ SOLN: 4.0000 mg | Freq: Once | INTRAMUSCULAR | Status: DC | PRN

## 2017-08-12 MED ORDER — LIDOCAINE 2% (20 MG/ML) 5 ML SYRINGE
INTRAMUSCULAR | Status: DC | PRN
  Administered 2017-08-12: 60 mg via INTRAVENOUS

## 2017-08-12 MED ORDER — KETOROLAC TROMETHAMINE 30 MG/ML IJ SOLN: 30.0000 mg | Freq: Once | INTRAMUSCULAR | Status: DC | PRN

## 2017-08-12 MED ORDER — FENTANYL CITRATE (PF) 100 MCG/2ML IJ SOLN: 25.0000 ug | INTRAMUSCULAR | Status: DC | PRN

## 2017-08-12 MED ORDER — PROPOFOL 10 MG/ML IV BOLUS
INTRAVENOUS | Status: DC | PRN
Start: 2017-08-12 — End: 2017-08-12
  Administered 2017-08-12 (×2): 20 mg via INTRAVENOUS
  Administered 2017-08-12: 30 mg via INTRAVENOUS
  Administered 2017-08-12: 20 mg via INTRAVENOUS
  Administered 2017-08-12: 40 mg via INTRAVENOUS
  Administered 2017-08-12 (×4): 20 mg via INTRAVENOUS

## 2017-08-12 MED ORDER — ACETAMINOPHEN 325 MG PO TABS: 325.0000 mg | ORAL_TABLET | ORAL | Status: DC | PRN

## 2017-08-12 MED ORDER — ONDANSETRON HCL 4 MG/2ML IJ SOLN
INTRAMUSCULAR | Status: AC
Start: 2017-08-12 — End: 2017-08-12
  Filled 2017-08-12: qty 2

## 2017-08-12 MED ORDER — MIDAZOLAM HCL 5 MG/5ML IJ SOLN
INTRAMUSCULAR | Status: DC | PRN
  Administered 2017-08-12: 2 mg via INTRAVENOUS

## 2017-08-12 MED ORDER — LIDOCAINE-EPINEPHRINE 1 %-1:100000 IJ SOLN
INTRAMUSCULAR | Status: DC | PRN
  Administered 2017-08-12: 20 mL

## 2017-08-12 MED ORDER — FENTANYL CITRATE (PF) 100 MCG/2ML IJ SOLN
INTRAMUSCULAR | Status: AC
  Filled 2017-08-12: qty 2

## 2017-08-12 MED ORDER — PROPOFOL 10 MG/ML IV BOLUS
INTRAVENOUS | Status: AC
  Filled 2017-08-12: qty 20

## 2017-08-12 MED ORDER — OXYCODONE HCL 5 MG/5ML PO SOLN: 5.0000 mg | Freq: Once | ORAL | Status: DC | PRN

## 2017-08-12 MED ORDER — SCOPOLAMINE 1 MG/3DAYS TD PT72
MEDICATED_PATCH | TRANSDERMAL | Status: AC
  Administered 2017-08-12: 1.5 mg via TRANSDERMAL
  Filled 2017-08-12: qty 1

## 2017-08-12 MED ORDER — MEPERIDINE HCL 25 MG/ML IJ SOLN: 6.2500 mg | INTRAMUSCULAR | Status: DC | PRN

## 2017-08-12 MED ORDER — DEXAMETHASONE SODIUM PHOSPHATE 10 MG/ML IJ SOLN
INTRAMUSCULAR | Status: AC
Start: 2017-08-12 — End: 2017-08-12
  Filled 2017-08-12: qty 1

## 2017-08-12 MED ORDER — FENTANYL CITRATE (PF) 100 MCG/2ML IJ SOLN
INTRAMUSCULAR | Status: DC | PRN
  Administered 2017-08-12: 100 ug via INTRAVENOUS

## 2017-08-12 MED ORDER — SCOPOLAMINE 1 MG/3DAYS TD PT72
1.0000 | MEDICATED_PATCH | Freq: Once | TRANSDERMAL | Status: DC
  Administered 2017-08-12: 1.5 mg via TRANSDERMAL

## 2017-08-12 MED ORDER — MIDAZOLAM HCL 2 MG/2ML IJ SOLN
INTRAMUSCULAR | Status: AC
Start: 2017-08-12 — End: 2017-08-12
  Filled 2017-08-12: qty 2

## 2017-08-12 MED ORDER — LIDOCAINE HCL (CARDIAC) 20 MG/ML IV SOLN
INTRAVENOUS | Status: AC
  Filled 2017-08-12: qty 5

## 2017-08-12 MED ORDER — OXYCODONE HCL 5 MG PO TABS: 5.0000 mg | ORAL_TABLET | Freq: Once | ORAL | Status: DC | PRN

## 2017-08-12 MED ORDER — LACTATED RINGERS IV SOLN
INTRAVENOUS | Status: DC
  Administered 2017-08-12: 12:00:00 via INTRAVENOUS

## 2017-08-12 MED ORDER — LIDOCAINE-EPINEPHRINE 1 %-1:100000 IJ SOLN
INTRAMUSCULAR | Status: AC
Start: 2017-08-12 — End: 2017-08-12
  Filled 2017-08-12: qty 1

## 2017-08-12 MED ORDER — ACETAMINOPHEN 160 MG/5ML PO SOLN: 325.0000 mg | ORAL | Status: DC | PRN

## 2017-08-12 MED ORDER — ONDANSETRON HCL 4 MG/2ML IJ SOLN
INTRAMUSCULAR | Status: DC | PRN
  Administered 2017-08-12: 4 mg via INTRAVENOUS

## 2017-08-12 MED ORDER — METHYLERGONOVINE MALEATE 0.2 MG/ML IJ SOLN
INTRAMUSCULAR | Status: DC | PRN
  Administered 2017-08-12: 0.2 mg via INTRAMUSCULAR

## 2017-08-12 MED ORDER — METHYLERGONOVINE MALEATE 0.2 MG/ML IJ SOLN
INTRAMUSCULAR | Status: AC
  Filled 2017-08-12: qty 1

## 2017-08-12 MED ORDER — KETOROLAC TROMETHAMINE 30 MG/ML IJ SOLN
INTRAMUSCULAR | Status: DC | PRN
  Administered 2017-08-12: 30 mg via INTRAVENOUS

## 2017-08-12 MED ORDER — DEXAMETHASONE SODIUM PHOSPHATE 10 MG/ML IJ SOLN
INTRAMUSCULAR | Status: DC | PRN
  Administered 2017-08-12: 10 mg via INTRAVENOUS

## 2017-08-12 SURGICAL SUPPLY — 18 items
CATH ROBINSON RED A/P 16FR (CATHETERS) ×3 IMPLANT
DECANTER SPIKE VIAL GLASS SM (MISCELLANEOUS) ×3 IMPLANT
GLOVE BIO SURGEON STRL SZ8 (GLOVE) ×3 IMPLANT
GLOVE BIOGEL PI IND STRL 7.0 (GLOVE) ×1 IMPLANT
GLOVE BIOGEL PI INDICATOR 7.0 (GLOVE) ×2
GLOVE SURG ORTHO 8.0 STRL STRW (GLOVE) ×3 IMPLANT
GOWN STRL REUS W/TWL LRG LVL3 (GOWN DISPOSABLE) ×6 IMPLANT
KIT BERKELEY 1ST TRIMESTER 3/8 (MISCELLANEOUS) ×3 IMPLANT
NS IRRIG 1000ML POUR BTL (IV SOLUTION) ×3 IMPLANT
PACK VAGINAL MINOR WOMEN LF (CUSTOM PROCEDURE TRAY) ×3 IMPLANT
PAD OB MATERNITY 4.3X12.25 (PERSONAL CARE ITEMS) ×3 IMPLANT
PAD PREP 24X48 CUFFED NSTRL (MISCELLANEOUS) ×3 IMPLANT
SET BERKELEY SUCTION TUBING (SUCTIONS) ×3 IMPLANT
TOWEL OR 17X24 6PK STRL BLUE (TOWEL DISPOSABLE) ×6 IMPLANT
VACURETTE 10 RIGID CVD (CANNULA) IMPLANT
VACURETTE 7MM CVD STRL WRAP (CANNULA) ×3 IMPLANT
VACURETTE 8 RIGID CVD (CANNULA) IMPLANT
VACURETTE 9 RIGID CVD (CANNULA) IMPLANT

## 2017-08-12 NOTE — Anesthesia Preprocedure Evaluation (Addendum)
Anesthesia Evaluation  Patient identified by MRN, date of birth, ID band Patient awake    Reviewed: Allergy & Precautions, H&P , NPO status , Patient's Chart, lab work & pertinent test results  Airway Mallampati: I  TM Distance: >3 FB Neck ROM: full    Dental no notable dental hx. (+) Teeth Intact   Pulmonary neg pulmonary ROS,    Pulmonary exam normal breath sounds clear to auscultation       Cardiovascular negative cardio ROS Normal cardiovascular exam Rhythm:regular Rate:Normal     Neuro/Psych negative neurological ROS     GI/Hepatic negative GI ROS, Neg liver ROS,   Endo/Other  negative endocrine ROS  Renal/GU negative Renal ROS  negative genitourinary   Musculoskeletal negative musculoskeletal ROS (+)   Abdominal Normal abdominal exam  (+)   Peds  Hematology negative hematology ROS (+)   Anesthesia Other Findings   Reproductive/Obstetrics (+) Pregnancy                             Anesthesia Physical Anesthesia Plan  ASA: II  Anesthesia Plan: MAC   Post-op Pain Management:    Induction:   PONV Risk Score and Plan: 3 and Ondansetron, Dexamethasone and Scopolamine patch - Pre-op  Airway Management Planned: Nasal Cannula, Natural Airway and Simple Face Mask  Additional Equipment:   Intra-op Plan:   Post-operative Plan:   Informed Consent: I have reviewed the patients History and Physical, chart, labs and discussed the procedure including the risks, benefits and alternatives for the proposed anesthesia with the patient or authorized representative who has indicated his/her understanding and acceptance.     Plan Discussed with: Surgeon and CRNA  Anesthesia Plan Comments:        Anesthesia Quick Evaluation

## 2017-08-12 NOTE — Op Note (Signed)
NAME:  Sharon Richards NO.:  [id number]  MEDICAL RECORD NO.:  [id number]  LOCATION:                                 FACILITY:  PHYSICIAN:  Monia Sabal. Corinna Capra, M.D.         DATE OF BIRTH:  DATE OF PROCEDURE:  08/12/2017 DATE OF DISCHARGE:                              OPERATIVE REPORT   PREOPERATIVE DIAGNOSIS:  Embryonic demise at 10 weeks' gestational age.  POSTOPERATIVE DIAGNOSIS:  Embryonic demise at 27 weeks' gestational age.  PROCEDURE:  Dilation and evacuation.  SURGEON:  Monia Sabal. Corinna Capra, M.D.  ANESTHESIA:  Monitored anesthetic care and paracervical block.  INDICATIONS:  [name] is a 37 year old who presented to my office today for followup ultrasound last week.  She was 6 weeks 2 days with a slow fetal heartbeat.  She was supposed to be 7 weeks 2 days today. Ultrasound shows a 5 weeks 5 day fetal demise.  She desires dilation and evacuation.  Blood type is O positive.  Risks and benefits were discussed at length.  Informed consent was obtained.  DESCRIPTION OF PROCEDURE:  After adequate anesthesia, the patient was placed in the dorsal lithotomy position.  She was sterilely prepped and draped.  Bladder was sterilely drained.  Graves speculum was placed. Tenaculum was placed into the cervix.  Paracervical block was placed with 1% xylocaine with 1:100,000 epinephrine, total 20 mL was used. Uterus sounded to 8 cm, easily dilated to a #21 Pratt dilator.  A 7 mm suction curette was inserted.  Suction curettage was performed until a gritty surface was felt throughout the endometrial cavity and no residual products retrieved.  The curette was then removed.  Tenaculum was removed from the cervix, noted to be hemostatic.  The patient was given Methergine 0.2 mg IM with good uterine response and minimal bleeding.  The patient was also given Toradol.  The patient was stable on transfer to recovery room.  Sponges and instrument counts were normal x3.  Estimated blood  loss was minimal.  The patient will be discharged home with followup in the office in 2 to 3 weeks.  She will be sent out with a routine instruction sheet for D and E.  told to return for increased pain, fever, bleeding.     Monia Sabal Corinna Capra, M.D.     DCL/MEDQ  D:  08/12/2017  T:  08/12/2017  Job:  [id number]

## 2017-08-12 NOTE — Brief Op Note (Signed)
08/12/2017  1:42 PM  PATIENT:  Sharon Richards  37 y.o. female  PRE-OPERATIVE DIAGNOSIS:  missed ab  POST-OPERATIVE DIAGNOSIS:  missed ab  PROCEDURE:  Procedure(s): DILATATION AND EVACUATION (N/A)  SURGEON:  Surgeon(s) and Role:    * Louretta Shorten, MD - Primary  PHYSICIAN ASSISTANT:   ASSISTANTS: none   ANESTHESIA:   local and general  EBL:  minimal   BLOOD ADMINISTERED:none  DRAINS: none   LOCAL MEDICATIONS USED:  LIDOCAINE   SPECIMEN:  No Specimen and Source of Specimen:  POC  DISPOSITION OF SPECIMEN:  PATHOLOGY  COUNTS:  YES  TOURNIQUET:  * No tourniquets in log *  DICTATION: .Other Dictation: Dictation Number 1  PLAN OF CARE: Discharge to home after PACU  PATIENT DISPOSITION:  PACU - hemodynamically stable.   Delay start of Pharmacological VTE agent (>24hrs) due to surgical blood loss or risk of bleeding: not applicable

## 2017-08-12 NOTE — Transfer of Care (Signed)
Immediate Anesthesia Transfer of Care Note  Patient: Sharon Richards  Procedure(s) Performed: DILATATION AND EVACUATION (N/A )  Patient Location: PACU  Anesthesia Type:MAC  Level of Consciousness: awake, alert  and oriented  Airway & Oxygen Therapy: Patient Spontanous Breathing and Patient connected to nasal cannula oxygen  Post-op Assessment: Report given to RN and Post -op Vital signs reviewed and stable  Post vital signs: Reviewed and stable  Last Vitals:  Vitals Value Taken Time  BP 95/58 08/12/2017  1:49 PM  Temp    Pulse 73 08/12/2017  1:50 PM  Resp 18 08/12/2017  1:50 PM  SpO2 100 % 08/12/2017  1:50 PM  Vitals shown include unvalidated device data.  Last Pain:  Vitals:   08/12/17 1144  TempSrc: Oral      Patients Stated Pain Goal: 5 (81/18/86 7737)  Complications: No apparent anesthesia complications

## 2017-08-12 NOTE — Anesthesia Postprocedure Evaluation (Signed)
Anesthesia Post Note  Patient: Sharon Richards  Procedure(s) Performed: DILATATION AND EVACUATION (N/A )     Patient location during evaluation: PACU Anesthesia Type: MAC Level of consciousness: awake Pain management: pain level controlled Vital Signs Assessment: post-procedure vital signs reviewed and stable Respiratory status: spontaneous breathing Cardiovascular status: stable Postop Assessment: no apparent nausea or vomiting Anesthetic complications: no    Last Vitals:  Vitals:   08/12/17 1422 08/12/17 1430  BP:    Pulse: 69 67  Resp: 17 15  Temp:    SpO2: 98% 100%    Last Pain:  Vitals:   08/12/17 1415  TempSrc:   PainSc: 5    Pain Goal: Patients Stated Pain Goal: 5 (08/12/17 1415)               [place]

## 2017-08-12 NOTE — H&P (Signed)
Sharon Richards is an 37 y.o. female. Presents for D&E for embryonic demise. US showed 6 2/7 last week with slow FHR and today 5 5/7 without FHR.  SHe desires D&E  Blood type O+     Past Medical History:  Diagnosis Date  . 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  Marland Kitchen ADHD (attention deficit hyperactivity disorder)    dx'd in middle school, has always been on adderall  . Fibroid   . Miscarriage 2018    Past Surgical History:  Procedure Laterality Date  . CESAREAN SECTION N/A 09/23/2013   Procedure: CESAREAN SECTION;  Surgeon: Cyril Mourning, MD;  Location: [facility];  Service: Obstetrics;  Laterality: N/A;  . DILATION AND CURETTAGE OF UTERUS  2002  . incision and drainage of nasal abscess  09/24/15   Dr. Wilburn Cornelia (MRSA)  . LIPOSUCTION  2003    Family History  Problem Relation Age of Onset  . Heart disease Maternal Grandmother   . Asthma Sister   . Colon cancer Neg Hx   . Breast cancer Neg Hx     Social History:  reports that she has never smoked. She has never used smokeless tobacco. She reports that she drinks alcohol. She reports that she does not use drugs.  Allergies:  Allergies  Allergen Reactions  . Penicillins Rash    Medications Prior to Admission  Medication Sig Dispense Refill Last Dose  . amphetamine-dextroamphetamine (ADDERALL XR) 20 MG 24 hr capsule Take 1 capsule (20 mg total) by mouth every morning. 30 capsule 0 Past Month at Unknown time  . amphetamine-dextroamphetamine (ADDERALL) 10 MG tablet 1 tab po q 2 pm 30 tablet 0 Past Month at Unknown time  . SENNA PO Take 2 capsules by mouth daily.   08/11/2017 at Unknown time    ROS  Blood pressure 108/60, pulse 65, temperature 97.7 F (36.5 C), temperature source Oral, resp. rate 16, height 4\' 11"  (1.499 m), weight 136 lb (61.7 kg), SpO2 100 %, unknown if currently breastfeeding. Physical Exam [place] today 5 5/7 embryonic demise   Results for orders  placed or performed during the hospital encounter of 08/12/17 (from the past 24 hour(s))  CBC     Status: None   Collection Time: 08/12/17 11:37 AM  Result Value Ref Range   WBC 7.2 4.0 - 10.5 K/uL   RBC 4.54 3.87 - 5.11 MIL/uL   Hemoglobin 12.2 12.0 - 15.0 g/dL   HCT 37.5 36.0 - 46.0 %   MCV 82.6 78.0 - 100.0 fL   MCH 26.9 26.0 - 34.0 pg   MCHC 32.5 30.0 - 36.0 g/dL   RDW 13.6 11.5 - 15.5 %   Platelets 294 150 - 400 K/uL    No results found.  Assessment/Plan: IUP at 6 weeks with embryonic demise Patients second SAB.  Wants D&E.  R&B discussed with patient and informed consent obtained. This patient has been seen and examined.   All of her questions were answered.  Labs and vital signs reviewed.  Informed consent has been obtained.  The History and Physical is current.  Sharon Richards C 08/12/2017, 1:20 PM

## 2017-08-13 ENCOUNTER — Encounter (HOSPITAL_COMMUNITY): Payer: Self-pay | Admitting: Obstetrics and Gynecology

## 2017-08-15 ENCOUNTER — Encounter: Payer: Self-pay | Admitting: Family Medicine

## 2022-04-28 NOTE — Progress Notes (Signed)
This encounter was created in error - please disregard.
